# Patient Record
Sex: Female | Born: 1940 | Race: Black or African American | Hispanic: No | Marital: Married | Smoking: Smoker, current status unknown
Health system: Southern US, Community
[De-identification: ages and names within clinical notes are randomized; demographics above are authoritative.]

---

## 2005-01-06 ENCOUNTER — Other Ambulatory Visit: Payer: Self-pay

## 2005-05-13 ENCOUNTER — Other Ambulatory Visit: Payer: Self-pay

## 2007-02-25 ENCOUNTER — Other Ambulatory Visit: Payer: Self-pay

## 2007-03-23 ENCOUNTER — Other Ambulatory Visit: Payer: Self-pay

## 2007-03-24 ENCOUNTER — Other Ambulatory Visit: Payer: Self-pay

## 2011-06-28 ENCOUNTER — Other Ambulatory Visit: Payer: Self-pay

## 2011-07-24 ENCOUNTER — Other Ambulatory Visit: Payer: Self-pay | Admitting: Cardiothoracic Surgery

## 2011-09-03 ENCOUNTER — Other Ambulatory Visit: Payer: Self-pay

## 2011-11-19 ENCOUNTER — Other Ambulatory Visit: Payer: Self-pay | Admitting: Emergency Medicine

## 2013-08-14 IMAGING — CR DG CHEST 1V
1 series · 1 of 1 positions shown · non-contrast
Comparison: none

REASON FOR EXAM: FEVER, COUGH
COMMENTS:   May transport without cardiac monitor

[view not recorded]
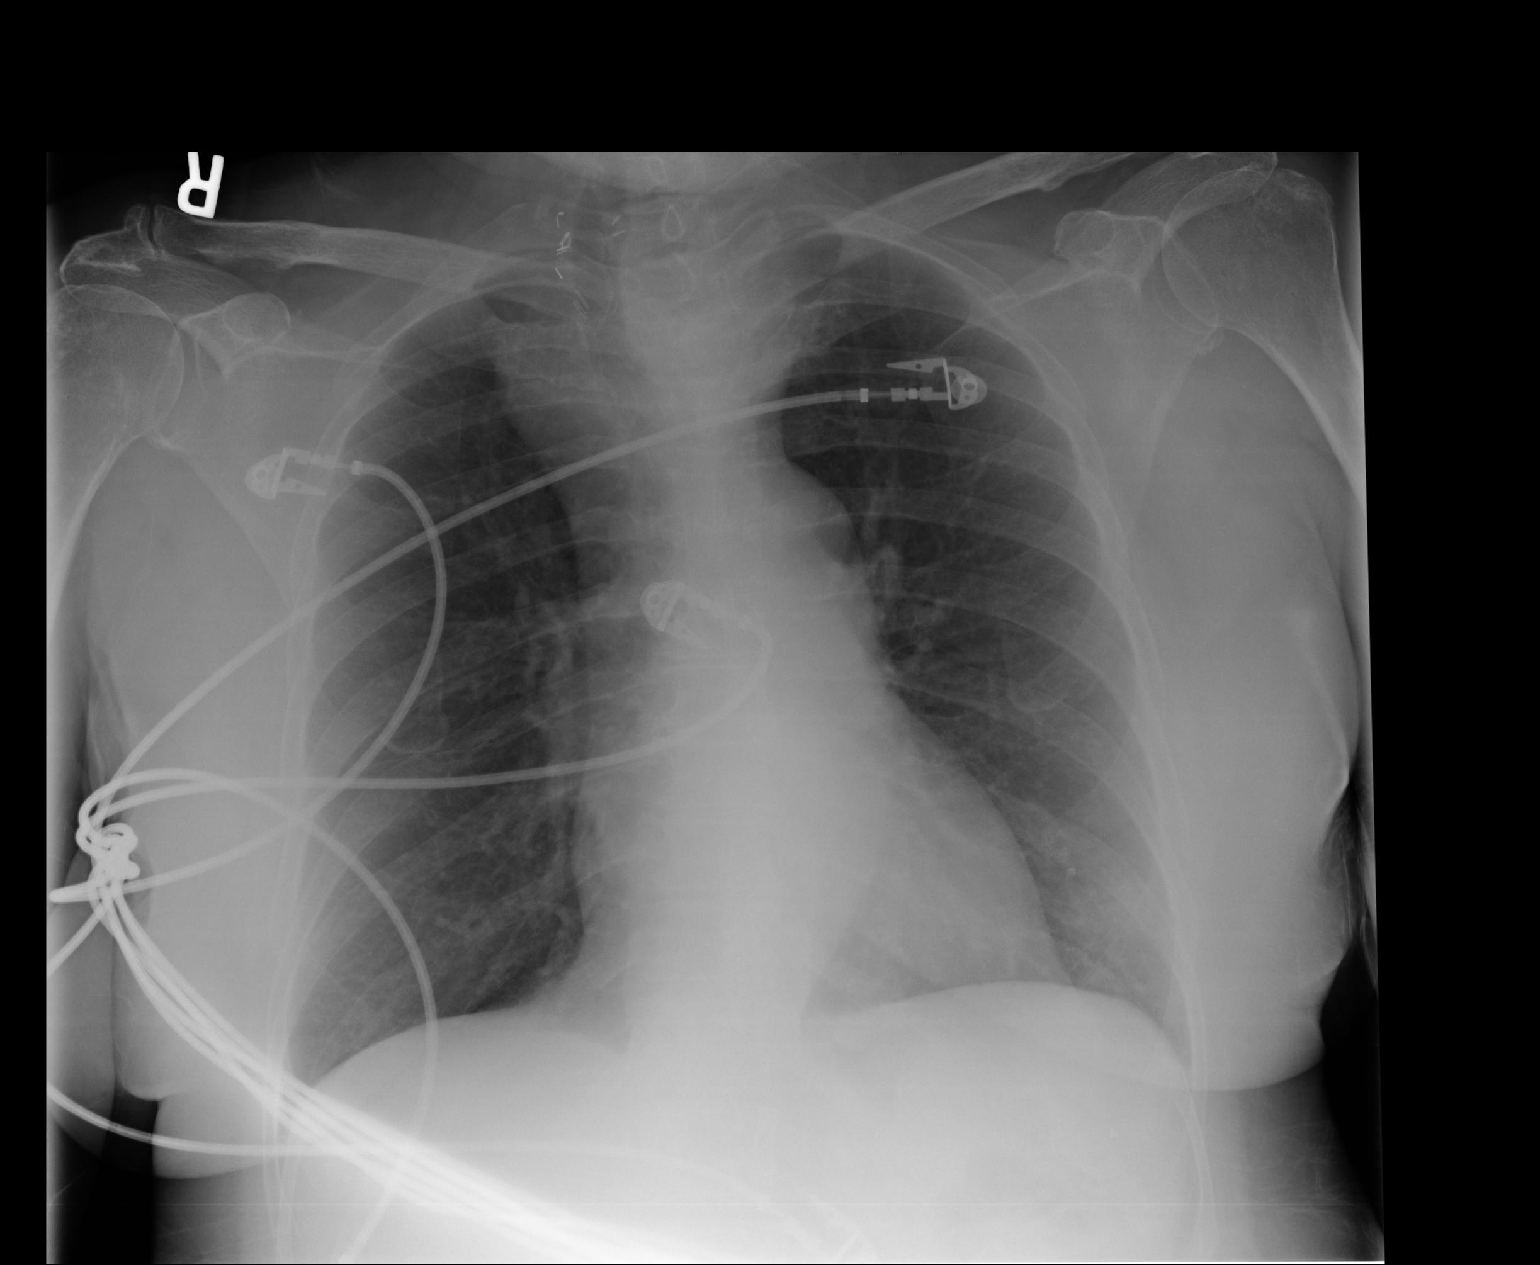

[1 of 1 positions shown; findings below may reference images not displayed]

PROCEDURE:     DXR - DXR CHEST 1 VIEWAP OR PA  - June 28, 2011 [DATE]

RESULT:     Comparison is made to the study 18 February, 2011.

The lungs are well-expanded and clear. The cardiac silhouette is normal in
size. There is tortuosity of the descending thoracic aorta. There is soft
tissue fullness in the superior mediastinum with displacement of the trachea
toward the right. This is not new but is subjectively more prominent today
likely due to the AP technique. I see no pulmonary vascular congestion or
alveolar infiltrate or pleural effusion.
IMPRESSION: I do not see evidence of pneumonia. There is persistent
soft tissue abnormality in the superior mediastinum. Further evaluation of
this region is recommended to exclude underlying vascular masses,
lymphadenopathy, or thyroid pathology.

## 2013-08-14 IMAGING — CT CT HEAD WITHOUT CONTRAST
2 series · 15 of 30 positions shown, 19 images · non-contrast
Comparison: none

REASON FOR EXAM: HTN, HA
COMMENTS:   May transport without cardiac monitor

[Series 2: without · axial · non-contrast · 0.43mm/px · z∈[+288,+408]mm · 13 of 30 slices shown, 17 images]
[im 3/30  brain]
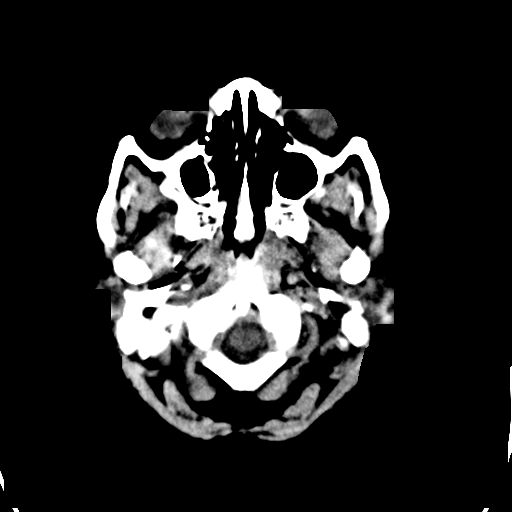
[im 3/30  bone]
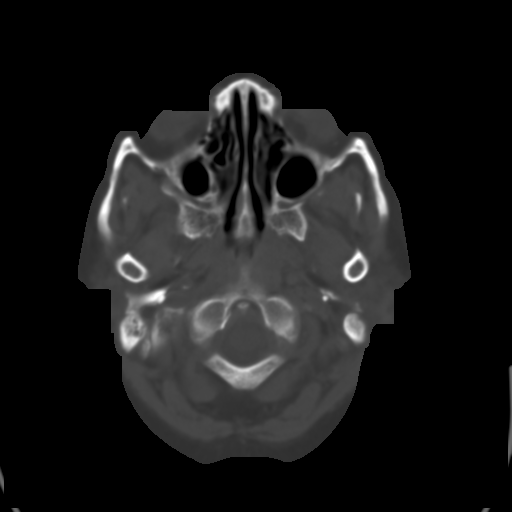
[im 5/30  brain]
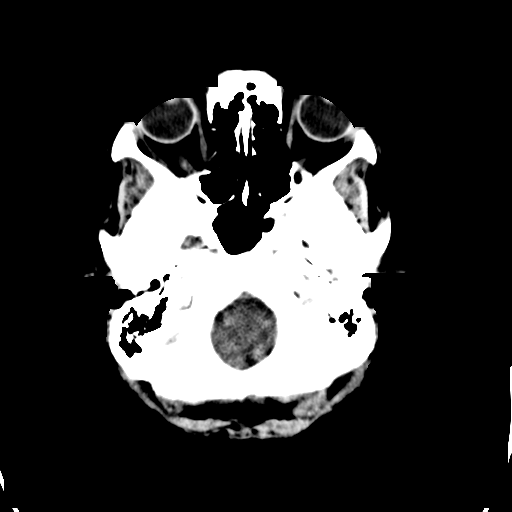
[im 7/30  brain]
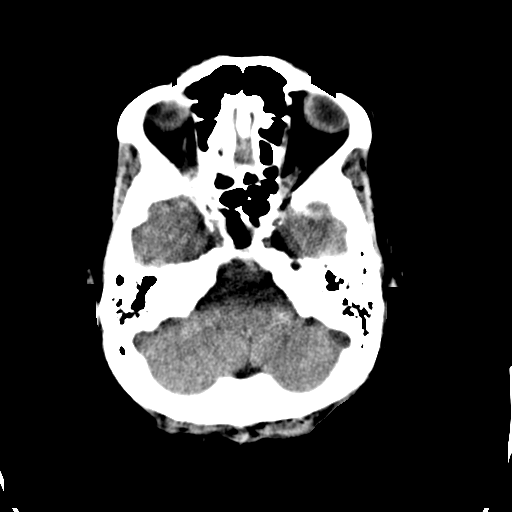
[im 9/30  brain]
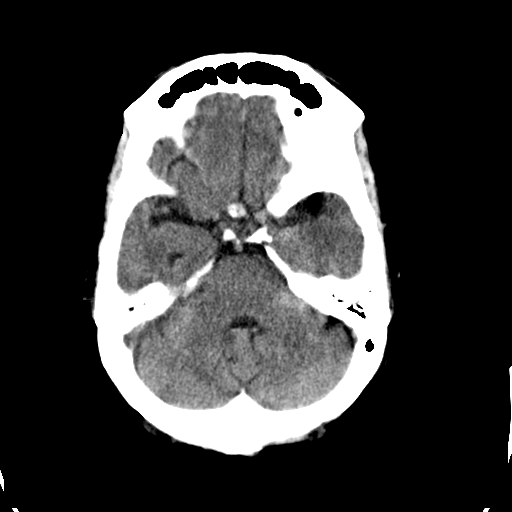
[im 11/30  brain]
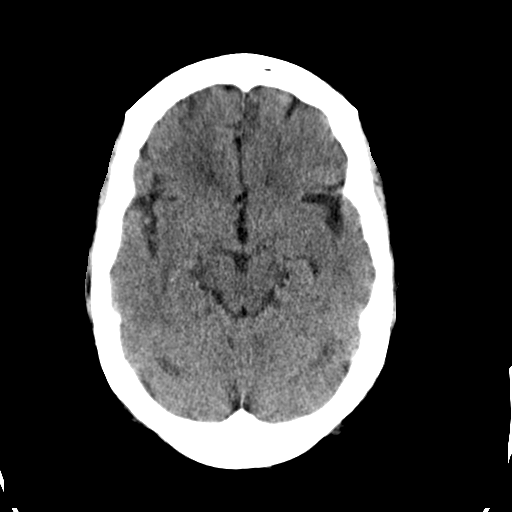
[im 11/30  bone]
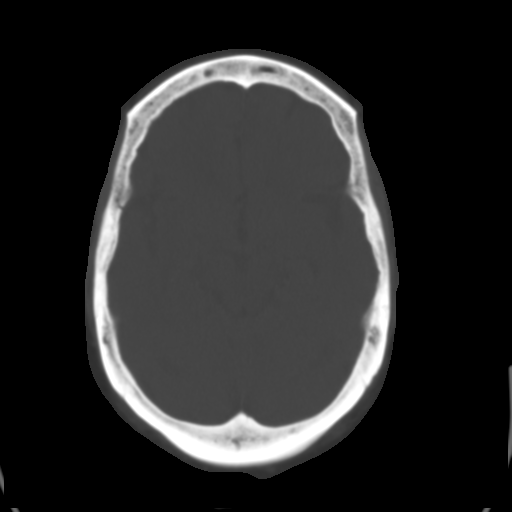
[im 13/30  brain]
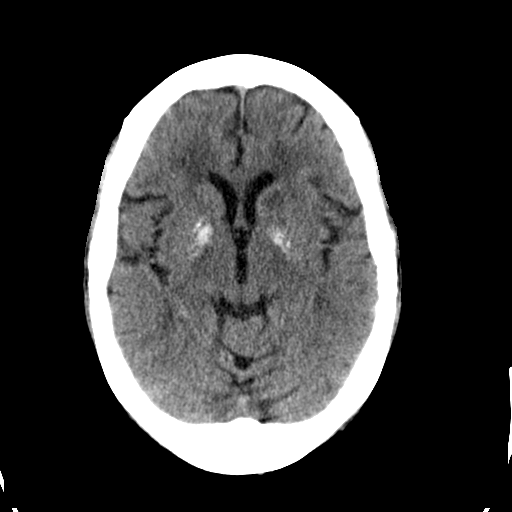
[im 15/30  brain]
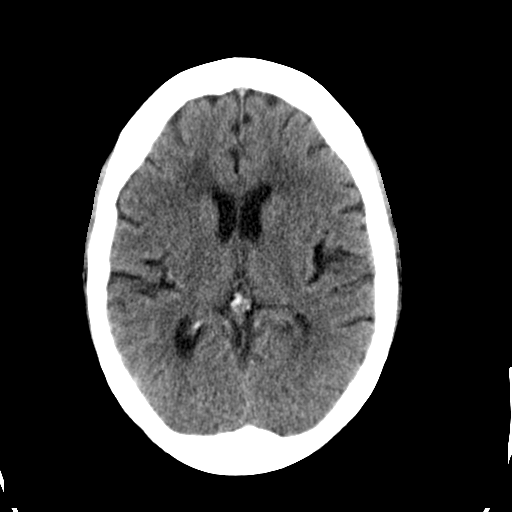
[im 17/30  brain]
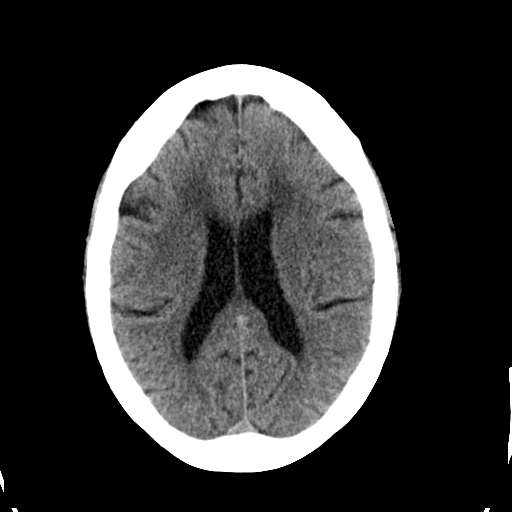
[im 19/30  brain]
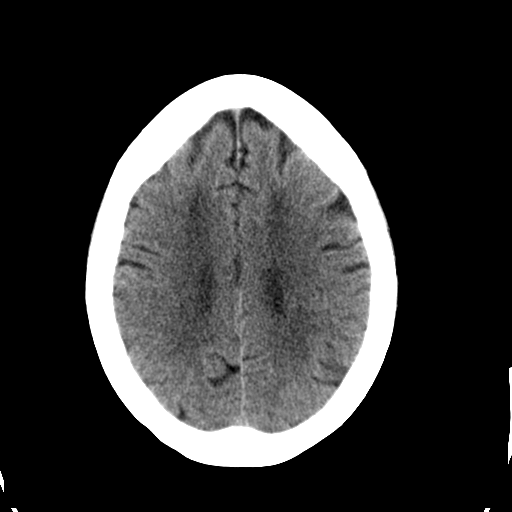
[im 19/30  bone]
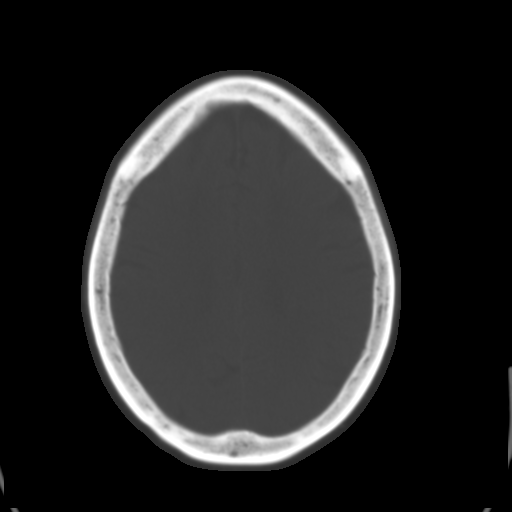
[im 21/30  brain]
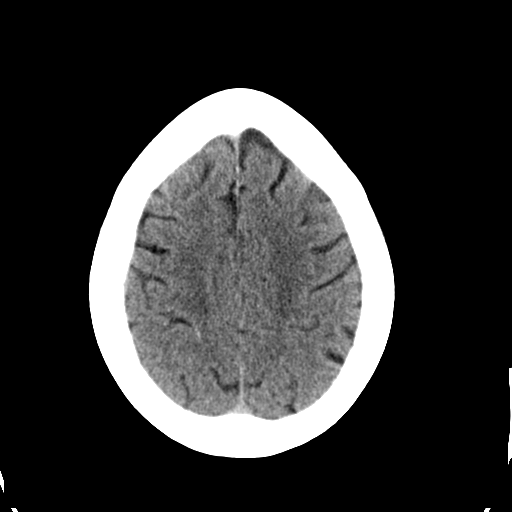
[im 23/30  brain]
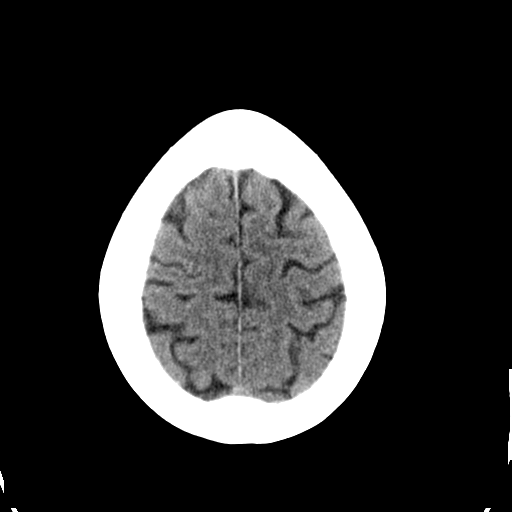
[im 25/30  brain]
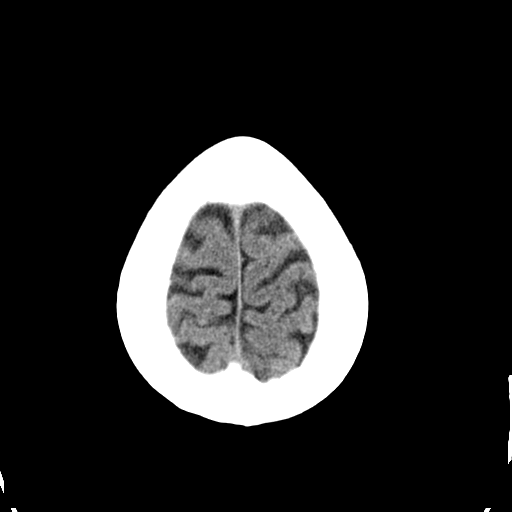
[im 27/30  brain]
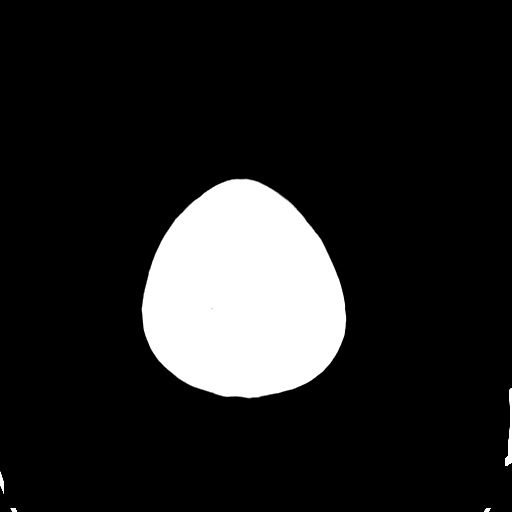
[im 27/30  bone]
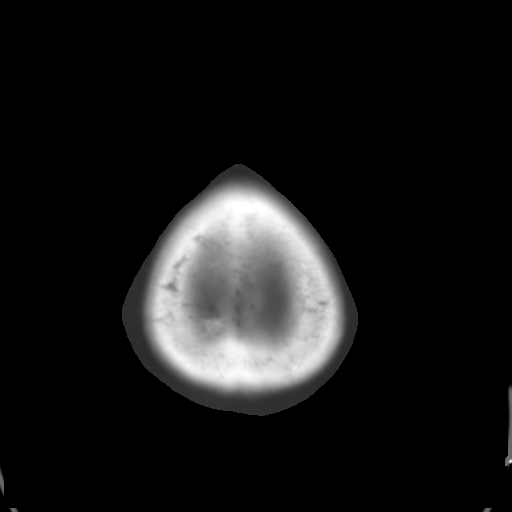

[Series 3: bone · axial · 0.43mm/px · z∈[+288,+308]mm · 2 of 30 slices shown]
[im 3/30  bone]
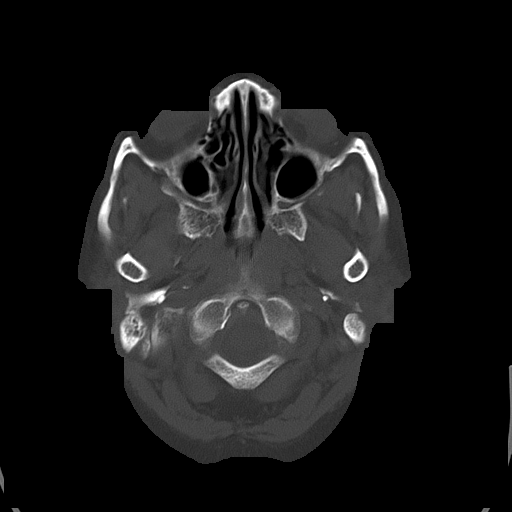
[im 7/30  bone]
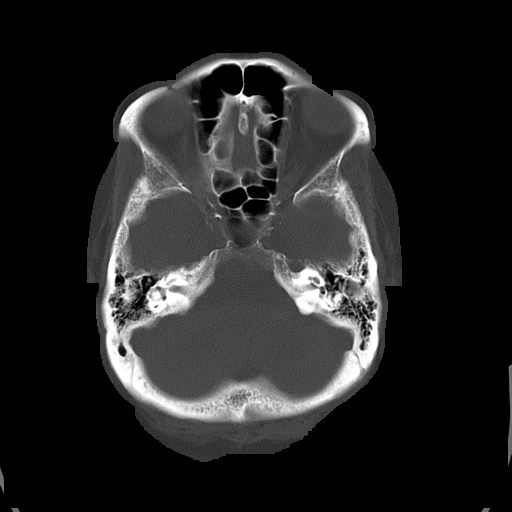

[15 of 30 positions shown; findings below may reference images not displayed]

PROCEDURE:     CT  - CT HEAD WITHOUT CONTRAST  - June 28, 2011 [DATE]

RESULT:     Axial noncontrast CT scanning was performed through the brain at
5 mm intervals and slice thicknesses. There are no previous studies for
comparison.

The ventricles are normal in size and position. There is dense calcification
in the basal ganglia bilaterally. There is decreased density in the left
caudate nucleus laterally consistent with previous ischemic insult. There is
decreased density in the deep white matter of both cerebral hemispheres
consistent with chronic small vessel ischemic type change. I do not see
evidence of an acute intracranial hemorrhage nor acute evolving ischemic
event. The cerebellum and brainstem are normal in density.

At bone window settings the observed portions of the paranasal sinuses and
mastoid air cells are clear. There is no evidence of an acute skull fracture.
IMPRESSION: 1. I see no evidence of an acute ischemic or hemorrhagic event.
2. There are chronic changes consistent with small vessel ischemia. An old
small lacunar infarction in the left caudate nucleus is suspected.
3. There are dense calcifications in the basal ganglia bilaterally.

## 2014-07-11 ENCOUNTER — Other Ambulatory Visit: Payer: Self-pay | Admitting: Internal Medicine

## 2014-07-12 ENCOUNTER — Other Ambulatory Visit: Payer: Self-pay | Admitting: Emergency Medicine

## 2014-07-14 ENCOUNTER — Encounter: Payer: Self-pay | Admitting: *Deleted

## 2014-07-14 ENCOUNTER — Other Ambulatory Visit (HOSPITAL_COMMUNITY): Payer: Self-pay | Admitting: Physician Assistant

## 2014-07-14 ENCOUNTER — Inpatient Hospital Stay (HOSPITAL_COMMUNITY)
Admission: RE | Admit: 2014-07-14 | Discharge: 2014-08-04 | DRG: 057 | Disposition: A | Discharge status: Home Health | Payer: Medicare PPO | Source: Other Acute Inpatient Hospital | Attending: Physical Medicine & Rehabilitation | Admitting: Physical Medicine & Rehabilitation

## 2014-07-14 DIAGNOSIS — I10 Essential (primary) hypertension: Secondary | ICD-10-CM | POA: Diagnosis not present

## 2014-07-14 DIAGNOSIS — Z794 Long term (current) use of insulin: Secondary | ICD-10-CM | POA: Diagnosis not present

## 2014-07-14 DIAGNOSIS — K5900 Constipation, unspecified: Secondary | ICD-10-CM | POA: Diagnosis not present

## 2014-07-14 DIAGNOSIS — Z880 Allergy status to penicillin: Secondary | ICD-10-CM

## 2014-07-14 DIAGNOSIS — I69351 Hemiplegia and hemiparesis following cerebral infarction affecting right dominant side: Secondary | ICD-10-CM | POA: Diagnosis present

## 2014-07-14 DIAGNOSIS — Z8249 Family history of ischemic heart disease and other diseases of the circulatory system: Secondary | ICD-10-CM | POA: Diagnosis not present

## 2014-07-14 DIAGNOSIS — E785 Hyperlipidemia, unspecified: Secondary | ICD-10-CM | POA: Diagnosis present

## 2014-07-14 DIAGNOSIS — Z79899 Other long term (current) drug therapy: Secondary | ICD-10-CM | POA: Diagnosis not present

## 2014-07-14 DIAGNOSIS — E1149 Type 2 diabetes mellitus with other diabetic neurological complication: Secondary | ICD-10-CM | POA: Diagnosis not present

## 2014-07-14 DIAGNOSIS — Z833 Family history of diabetes mellitus: Secondary | ICD-10-CM | POA: Diagnosis not present

## 2014-07-14 DIAGNOSIS — N390 Urinary tract infection, site not specified: Secondary | ICD-10-CM | POA: Diagnosis present

## 2014-07-14 DIAGNOSIS — R29898 Other symptoms and signs involving the musculoskeletal system: Secondary | ICD-10-CM | POA: Diagnosis not present

## 2014-07-14 DIAGNOSIS — I63512 Cerebral infarction due to unspecified occlusion or stenosis of left middle cerebral artery: Secondary | ICD-10-CM

## 2014-07-14 DIAGNOSIS — Z7982 Long term (current) use of aspirin: Secondary | ICD-10-CM

## 2014-07-14 DIAGNOSIS — Z5189 Encounter for other specified aftercare: Secondary | ICD-10-CM

## 2014-07-14 DIAGNOSIS — I6330 Cerebral infarction due to thrombosis of unspecified cerebral artery: Secondary | ICD-10-CM

## 2014-07-14 DIAGNOSIS — E1142 Type 2 diabetes mellitus with diabetic polyneuropathy: Secondary | ICD-10-CM | POA: Diagnosis present

## 2014-07-14 DIAGNOSIS — F17200 Nicotine dependence, unspecified, uncomplicated: Secondary | ICD-10-CM | POA: Diagnosis not present

## 2014-07-14 DIAGNOSIS — I6350 Cerebral infarction due to unspecified occlusion or stenosis of unspecified cerebral artery: Secondary | ICD-10-CM | POA: Diagnosis not present

## 2014-07-14 DIAGNOSIS — R32 Unspecified urinary incontinence: Secondary | ICD-10-CM | POA: Diagnosis not present

## 2014-07-14 DIAGNOSIS — G8110 Spastic hemiplegia affecting unspecified side: Secondary | ICD-10-CM | POA: Diagnosis not present

## 2014-07-14 DIAGNOSIS — I639 Cerebral infarction, unspecified: Secondary | ICD-10-CM | POA: Diagnosis present

## 2014-07-14 DIAGNOSIS — R471 Dysarthria and anarthria: Secondary | ICD-10-CM | POA: Diagnosis not present

## 2014-07-14 LAB — GLUCOSE, CAPILLARY
Glucose-Capillary: 145 mg/dL — ABNORMAL HIGH (ref 70–99)
Glucose-Capillary: 153 mg/dL — ABNORMAL HIGH (ref 70–99)

## 2014-07-14 MED ORDER — METOPROLOL TARTRATE 50 MG PO TABS
50.0000 mg | ORAL_TABLET | Freq: Every day | ORAL | Status: DC
  Administered 2014-07-15 – 2014-08-04 (×21): 50 mg via ORAL
  Filled 2014-07-14 (×22): qty 1

## 2014-07-14 MED ORDER — ASPIRIN 81 MG PO CHEW
81.0000 mg | CHEWABLE_TABLET | Freq: Every day | ORAL | Status: DC
  Administered 2014-07-15 – 2014-08-04 (×21): 81 mg via ORAL
  Filled 2014-07-14 (×23): qty 1

## 2014-07-14 MED ORDER — INSULIN GLARGINE 100 UNIT/ML ~~LOC~~ SOLN
25.0000 [IU] | Freq: Every day | SUBCUTANEOUS | Status: DC
  Administered 2014-07-15 – 2014-07-24 (×10): 25 [IU] via SUBCUTANEOUS
  Filled 2014-07-14 (×11): qty 0.25

## 2014-07-14 MED ORDER — ACETAMINOPHEN 325 MG PO TABS
325.0000 mg | ORAL_TABLET | ORAL | Status: DC | PRN
  Administered 2014-07-14 – 2014-08-02 (×17): 650 mg via ORAL
  Filled 2014-07-14 (×19): qty 2

## 2014-07-14 MED ORDER — SORBITOL 70 % SOLN
30.0000 mL | Freq: Every day | Status: DC | PRN
  Administered 2014-07-15 – 2014-08-03 (×2): 30 mL via ORAL
  Filled 2014-07-14 (×2): qty 30

## 2014-07-14 MED ORDER — INSULIN ASPART 100 UNIT/ML ~~LOC~~ SOLN
0.0000 [IU] | Freq: Every day | SUBCUTANEOUS | Status: DC
  Administered 2014-07-28: 3 [IU] via SUBCUTANEOUS

## 2014-07-14 MED ORDER — ONDANSETRON HCL 4 MG PO TABS: 4.0000 mg | ORAL_TABLET | Freq: Four times a day (QID) | ORAL | Status: DC | PRN

## 2014-07-14 MED ORDER — HYDROCHLOROTHIAZIDE 12.5 MG PO CAPS
12.5000 mg | ORAL_CAPSULE | Freq: Every day | ORAL | Status: DC
  Administered 2014-07-15 – 2014-07-17 (×3): 12.5 mg via ORAL
  Filled 2014-07-14 (×5): qty 1

## 2014-07-14 MED ORDER — OXYBUTYNIN CHLORIDE 5 MG PO TABS
2.5000 mg | ORAL_TABLET | Freq: Two times a day (BID) | ORAL | Status: DC
  Administered 2014-07-14 – 2014-07-26 (×24): 2.5 mg via ORAL
  Filled 2014-07-14 (×29): qty 0.5

## 2014-07-14 MED ORDER — AMLODIPINE BESYLATE 10 MG PO TABS
10.0000 mg | ORAL_TABLET | Freq: Every day | ORAL | Status: DC
  Administered 2014-07-15 – 2014-08-04 (×21): 10 mg via ORAL
  Filled 2014-07-14 (×22): qty 1

## 2014-07-14 MED ORDER — ONDANSETRON HCL 4 MG/2ML IJ SOLN: 4.0000 mg | Freq: Four times a day (QID) | INTRAMUSCULAR | Status: DC | PRN

## 2014-07-14 MED ORDER — INSULIN ASPART 100 UNIT/ML ~~LOC~~ SOLN
0.0000 [IU] | Freq: Three times a day (TID) | SUBCUTANEOUS | Status: DC
Start: 2014-07-15 — End: 2014-08-04
  Administered 2014-07-15: 1 [IU] via SUBCUTANEOUS
  Administered 2014-07-15: 2 [IU] via SUBCUTANEOUS
  Administered 2014-07-15: 1 [IU] via SUBCUTANEOUS
  Administered 2014-07-16: 3 [IU] via SUBCUTANEOUS
  Administered 2014-07-17 – 2014-07-18 (×3): 1 [IU] via SUBCUTANEOUS
  Administered 2014-07-18: 2 [IU] via SUBCUTANEOUS
  Administered 2014-07-18 – 2014-07-19 (×2): 1 [IU] via SUBCUTANEOUS
  Administered 2014-07-19 – 2014-07-20 (×2): 2 [IU] via SUBCUTANEOUS
  Administered 2014-07-20: 1 [IU] via SUBCUTANEOUS
  Administered 2014-07-21 – 2014-07-23 (×3): 3 [IU] via SUBCUTANEOUS
  Administered 2014-07-24 (×2): 1 [IU] via SUBCUTANEOUS
  Administered 2014-07-25 (×2): 2 [IU] via SUBCUTANEOUS
  Administered 2014-07-26: 3 [IU] via SUBCUTANEOUS
  Administered 2014-07-28: 5 [IU] via SUBCUTANEOUS
  Administered 2014-07-29 (×2): 2 [IU] via SUBCUTANEOUS
  Administered 2014-07-30: 3 [IU] via SUBCUTANEOUS
  Administered 2014-08-01 – 2014-08-03 (×4): 1 [IU] via SUBCUTANEOUS

## 2014-07-14 MED ORDER — ATORVASTATIN CALCIUM 80 MG PO TABS
80.0000 mg | ORAL_TABLET | Freq: Every day | ORAL | Status: DC
  Administered 2014-07-14 – 2014-08-03 (×21): 80 mg via ORAL
  Filled 2014-07-14 (×22): qty 1

## 2014-07-14 MED ORDER — CIPROFLOXACIN HCL 500 MG PO TABS
500.0000 mg | ORAL_TABLET | Freq: Two times a day (BID) | ORAL | Status: AC
  Administered 2014-07-14 – 2014-07-19 (×10): 500 mg via ORAL
  Filled 2014-07-14 (×10): qty 1

## 2014-07-14 MED ORDER — LISINOPRIL 40 MG PO TABS
40.0000 mg | ORAL_TABLET | Freq: Every day | ORAL | Status: DC
  Administered 2014-07-15 – 2014-08-04 (×21): 40 mg via ORAL
  Filled 2014-07-14 (×22): qty 1

## 2014-07-14 NOTE — Progress Notes (Signed)
[facility] Admission Coordinator Signed Physical Medicine and Rehabilitation PMR Pre-admission Service date: 07/14/2014 9:42 AM  Related encounter: Documentation from 07/14/2014 in [facility] Admission Coordinator Pre-Admission Assessment   Patient: Megan Moore is an 73 y.o., female MRN: [id number] DOB: 05-26-1941 Height: _0  (160 cm) Weight: 63.957 kg (141 lb)   Insurance Information HMO: yes    PPO:      PCP:      IPA:      80/20:      OTHER:   [facility]         Policy#: [id number]      Subscriber: self CM Name: Clayborne Artist, RN      Phone#: [phone]     Fax#: [phone] Follow up will be with onsite reviewer Shae. Pre-Cert#:                                Employer: retired Benefits:  Phone #: [phone]        Name: Ginnie Smart. Date: 11-03-13     Deduct: none      Out of Pocket Max: $4900 (met $183.28)      Life Max: unlimited CIR: $245/day for days 1-7; pre-auth needed      SNF: $0/day for days 1-20; $156/day for days 21-100 (100 day visit max, pre-auth needed) Outpatient: 100%     Co-Pay: $40 copay, based on med necessity Home Health: 100%      Co-Pay: no copay, based on med necessity DME: 80%     Co-Pay: 20% Providers: in network  Emergency Contact Information Contact Information     Name  Delia L    [phone]    [phone]     Oswaldo Milian  Daughter      [phone]     Arvid Right      [phone]     Kae Heller      [phone]         Current Medical History  Patient Admitting Diagnosis: Left internal capsule CVA   History of Present Illness: This 73 year old feamile with history of diabetes, hypertension, hyperlipidemia presented to [facility]) on 07-12-14 with worsening right sided weakness that started 2-3 days prior. The patient says that when she tried to get up and mover her  right side, she felt more and more weak. Pt came to ER on 9-8 and had head CT which was negative. She was noted to have a urinary tract infection, started on oral ciprofloxacin and discharged home. She took a couple doses of the Cipro but her weakness on the right side has progressively gotten worse. She then came to ER at [facility] for further evaluation. MRI testing revealed an acute stroke of the left side of the internal capsule. PT/OT have recommended inpatient rehab for further rehab needs and family is very supportive.    Patient's medical record from [facility] has been reviewed by the rehabilitation admission coordinator and physician.   Past Medical History  Diabetes, hypertension and hyperlipidemia. 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 These were not documented formally in [facility] records.   Family History  The patient's mother and father are both deceased. Mother died from  complications of renal failure (she was on dialysis) and she had a stroke. Father died from a stroke.   Prior Rehab/Hospitalizations: Dtr reports that pt had a previous hospitalization approx 3 years ago when part of her lung was removed and had an MI. No follow rehab after these events.     Current Medications See MAR   Patients Current Diet:  Low sodium diet   Precautions / Restrictions Precautions Precautions: Fall    Prior Activity Level Limited Community (1-2x/wk): Pt got out on limited errands and to church each week. [name] husband/family did the driving. Pt was independent in all ADL's.   Home Assistive Devices / Equipment Home Assistive Devices/Equipment: None      Prior Functional Level  Current Functional Level   Bed Mobility   Independent   Max assist (mod to max assist w/ R lat/posterior lean)    Transfers   Independent   Max assist (max assist x 2 stand pivot, altered midline)    Mobility - Walk/Wheelchair    Independent   Other (not assessed at this time)    Upper Body Dressing   Independent   Other (not assessed at this time, anticipate needs)    Lower Body Dressing   Independent   Max assist (max assist x 2 to don pants, unable to don sock)    Grooming   Independent   Other (unable to use dominant R hand, not assessed with L hand)    Eating/Drinking   Independent   Mod assist (unable to eat with R hand, needs food cut and containers opened)    Toilet Transfer   Independent   Other (using bed pan currently, anticipate max assist x 2 to [facility])    Bladder Continence     WFL   using Bedpan, some incontinence documented    Bowel Management   WFL   no BM documented since admit at [place]   Other (not assessed at this time.)    Communication   [facility]   [facility]    Memory   WFL   appears WFL    Cooking/Meal Prep   Independent        Housework   Independent      Money Management   Independent      Driving   Pt did not drive. Her husband/family did the driving.        Special needs/care consideration BiPAP/CPAP no   CPM no   Continuous Drip IV no   Dialysis no          Life Vest no   Oxygen no   Special Bed no   Trach Size no   Wound Vac (area) no        Skin no issues                                Bowel mgmt: pt has not had a BM since admit to [facility] on 9-9 Bladder mgmt:some urinary incontinence documented, using bed pan Diabetic mgmt - yes, managed with insulin at home   Previous Home Environment Living Arrangements: Spouse/significant other  Lives With: Spouse Available Help at Discharge: Family Type of Home: House Home Layout: One level Home Access: Stairs to enter Entrance Stairs-Rails: Psychiatric nurse of Steps: 6   Discharge Living Setting Plans for Discharge Living Setting: Patient's home Type of Home at Discharge: [facility]  Discharge Home Layout: One level Discharge Home Access: Stairs to enter Entrance  Stairs-Rails: Right;Left Entrance Stairs-Number of Steps: 6 Does the patient have any problems obtaining your medications?: No   Social/Family/Support Systems Patient Roles: Spouse (involved in her church) Sport and exercise psychologist Information: husband Jenny Reichmann and dtr Seth Bake are primary contacts Anticipated Caregiver: husband and children Anticipated Caregiver's Contact Information: see above Ability/Limitations of Caregiver: none, husband and children are very supportive Caregiver Availability: 24/7 Discharge Plan Discussed with Primary Caregiver: Yes (discussed with husband, dtr and 2 other family members) Is Caregiver In Agreement with Plan?: Yes Does Caregiver/Family have Issues with Lodging/Transportation while Pt is in Rehab?: No   Note: [name] son Karma Greaser is rehab tech at [facility] and very supportive of inpatient rehab.   Goals/Additional Needs Patient/Family Goal for Rehab: Minimal assistance to Supervision with PT/OT, Ind with SLP Expected length of stay: 11-13 days Cultural Considerations: none. Pt is involved in her church. Dietary Needs: regular low sodium diet Equipment Needs: to be determined Pt/Family Agrees to Admission and willing to participate: Yes Program Orientation Provided & Reviewed with Pt/Caregiver Including Roles  & Responsibilities: Yes   Patient Condition: I met with pt and her family at [facility]) on 07-13-14 to discuss the possibility of inpatient rehab. This 73 year old patient was previously independent and enjoyed her church prior to this new left internal capsule stroke. Pt is currently requiring maximal assistance with bed mobility and maximal assistance of 2 for stand pivot transfers. She is needed maximal help with most aspects of her activities of daily living and her dominant right hand has been affected by the stroke as well. Pt will benefit from the multi-disciplinary team of skilled PT, OT, SLP and rehab nursing to maximize her functional  return following this CVA. PT, OT and rehab nursing will focus on increasing strength for greater independence in bed mobility, transfers, gait and self care skills. In addition, pt will benefit from SLP intervention to determine any higher level cognitive needs as a result of the CVA. In addition, pt will benefit from rehab physician intervention to monitor her diabetes, HTN and HLP in addition to monitoring her stroke recovery. Discussed case with Dr. Naaman Plummer who stated that pt is a good inpatient rehab candidate. We received medical clearance from Dr. Netty Starring at [facility]. Pt and her family are motivated to come to inpatient rehab and will benefit from the intensive services of skilled therapy under rehab physician guidance. Pt will be admitted today on 07-14-14.   Preadmission Screen Completed By:  Nanetta Batty, PT, 07/14/2014 9:42 AM ______________________________________________________________________    Discussed status with Dr. Naaman Plummer on 07-14-14 at [phone] and received telephone approval for admission today.   Admission Coordinator:  Nanetta Batty, PT, time 0942/Date 07-14-14    Assessment/Plan: Diagnosis: left internal capsule infarct Does the need for close, 24 hr/day  Medical supervision in concert with the patient's rehab needs make it unreasonable for this patient to be served in a less intensive setting? Yes Co-Morbidities requiring supervision/potential complications: dm, htn, increased lipids Due to bladder management, bowel management, safety, skin/wound care, disease management, medication administration, pain management and patient education, does the patient require 24 hr/day rehab nursing? Yes Does the patient require coordinated care of a physician, rehab nurse, PT (1-2 hrs/day, 5 days/week), OT (1-2 hrs/day, 5 days/week) and SLP (1-2 hrs/day, 5 days/week) to address physical and functional deficits in the context of the above medical diagnosis(es)? Yes Addressing deficits in  the following areas: balance, endurance,  locomotion, strength, transferring, bowel/bladder control, bathing, dressing, feeding, grooming, toileting, speech,   and psychosocial support Can the patient actively participate in an intensive therapy program of at least 3 hrs of therapy 5 days a week? Yes The potential for patient to make measurable gains while on inpatient rehab is excellent Anticipated functional outcomes upon discharge from inpatients are: supervision and min assist PT, supervision and min assist OT, independent SLP Estimated rehab length of stay to reach the above functional goals is: 11-13 days Does the patient have adequate social supports to accommodate these discharge functional goals? Yes Anticipated D/C setting: Home Anticipated post D/C treatments: HH therapy and Outpatient therapy Overall Rehab/Functional Prognosis: excellent       RECOMMENDATIONS: This patient's condition is appropriate for continued rehabilitative care in the following setting: CIR Patient has agreed to participate in recommended program. Yes Note that insurance prior authorization may be required for reimbursement for recommended care.   Comment: Admit today.    Meredith Staggers, MD, [facility] 07/14/2014     Nanetta Batty, PT 07/14/2014  Revision History...     Date/Time User Action   07/14/2014 1:14 PM Meredith Staggers, MD Sign   07/14/2014 12:09 PM [facility] Details Report

## 2014-07-14 NOTE — Progress Notes (Signed)
Patient ID: RHYANN BERTON, female   DOB: Apr 06, 1941, 73 y.o.   MRN: [id number] Patient admitted to [phone] via stretcher, escorted by EMS staff.  Patient verbalized understanding of rehab process, no questions asked.  Patient was unable to sign fall prevention plan due to mobility deficits but assured staff she would not try to get up unassisted.  Call bell within reach.  Will continue to monitor.  Dani Gobble, RN

## 2014-07-14 NOTE — PMR Pre-admission (Signed)
[place] PMR Admission Coordinator Pre-Admission Assessment  Patient: Megan Moore is an 73 y.o., female MRN: [id number] DOB: 1941/10/13 Height: 5\' 3"  (160 cm) Weight: 63.957 kg (141 lb)  Insurance Information HMO: yes    PPO:      PCP:      IPA:      80/20:      [facility]:  [facility]       Policy#: [id number]      Subscriber: self CM Name: Megan Artist, RN      Phone#: [phone]     Fax#: [phone] Follow up will be with onsite reviewer Megan Moore. Pre-Cert#:                                Employer: retired Benefits:  Phone #: [phone]      Name: Megan Moore. Date: 11-03-13     Deduct: none      Out of Pocket Max: $4900 (met $183.28)      Life Max: unlimited CIR: $245/day for days 1-7; pre-auth needed      SNF: $0/day for days 1-20; $156/day for days 21-100 (100 day visit max, pre-auth needed) Outpatient: 100%     Co-Pay: $40 copay, based on med necessity Home Health: 100%      Co-Pay: no copay, based on med necessity DME: 80%     Co-Pay: 20% Providers: in network  Emergency Contact Information Contact Information   Name Megan Moore  [phone]  [phone]   Megan Moore Daughter   [phone]   Megan Moore   [phone]   Megan Moore   [phone]      Current Medical History  Patient Admitting Diagnosis: Left internal capsule CVA  History of Present Illness: This 73 year old feamile with history of diabetes, hypertension, hyperlipidemia presented to [facility]) on 07-12-14 with worsening Moore sided weakness that started 2-3 days prior. The patient says that when she tried to get up and mover her Moore side, she felt more and more weak. Pt came to ER on 9-8 and had head CT which was negative. She was noted to have a urinary tract infection, started on oral ciprofloxacin and discharged home. She took a couple doses of the Cipro but her weakness on the Moore side has progressively  gotten worse. She then came to ER at [facility] for further evaluation. MRI testing revealed an acute stroke of the left side of the internal capsule. PT/OT have recommended inpatient rehab for further rehab needs and family is very supportive.   Patient's medical record from [facility] has been reviewed by the rehabilitation admission coordinator and physician.  Past Medical History  Diabetes, hypertension and hyperlipidemia. 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 These were not documented formally in [facility] records.  Family History  The patient's mother and father are both deceased. Mother died from complications of renal failure (she was on dialysis) and she had a stroke. Father died from a stroke.  Prior Rehab/Hospitalizations: Dtr reports that pt had a previous hospitalization approx 3 years ago when part of her lung was removed and had an MI. No follow rehab after these events.   Current Medications See MAR  Patients Current Diet:  Low sodium diet  Precautions / Restrictions Precautions Precautions: Fall   Prior Activity Level Limited  Community (1-2x/wk): Pt got out on limited errands and to church each week. [name] husband/family did the driving. Pt was independent in all ADL's.  Home Assistive Devices / Equipment Home Assistive Devices/Equipment: None   Prior Functional Level Current Functional Level  Bed Mobility  Independent  Max assist (mod to max assist w/ R lat/posterior lean)   Transfers  Independent  Max assist (max assist x 2 stand pivot, altered midline)   Mobility - Walk/Wheelchair  Independent  Other (not assessed at this time)   Upper Body Dressing  Independent  Other (not assessed at this time, anticipate needs)   Lower Body Dressing  Independent  Max assist (max assist x 2 to don pants, unable to don sock)   Grooming  Independent  Other (unable to use dominant R hand, not assessed  with Moore hand)   Eating/Drinking  Independent  Mod assist (unable to eat with R hand, needs food cut and containers opened)   Toilet Transfer  Independent  Other (using bed pan currently, anticipate max assist x 2 to [facility])   Bladder Continence   WFL  using Bedpan, some incontinence documented   Bowel Management  WFL  no BM documented since admit at [place]  Other (not assessed at this time.)   Communication  [facility]  [facility]   Memory  WFL  appears WFL   Cooking/Meal Prep  Independent      Housework  Independent    Money Management  Independent    Driving    Pt did not drive. Her husband/family did the driving.    Special needs/care consideration BiPAP/CPAP no  CPM no  Continuous Drip IV no  Dialysis no         Life Vest no  Oxygen no  Special Bed no  Trach Size no  Wound Vac (area) no       Skin no issues                               Bowel mgmt: pt has not had a BM since [facility] on [facility]9 Bladder mgmt:some urinary incontinence documented, using bed pan Diabetic mgmt - yes, managed with insulin at home  Previous Home Environment Living Arrangements: Spouse/significant other  Lives With: Spouse Available Help at Discharge: Family Type of Home: House Home Layout: One level Home Access: Stairs to enter Entrance Stairs-Rails: Psychiatric nurse of Steps: 6  Discharge Living Setting Plans for Discharge Living Setting: Patient's home Type of Home at Discharge: House Discharge Home Layout: One level Discharge Home Access: Stairs to enter Entrance Stairs-Rails: Psychiatric nurse of Steps: 6 Does the patient have any problems obtaining your medications?: No  Social/Family/Support Systems Patient Roles: Spouse (involved in her church) Sport and exercise psychologist Information: husband Megan Moore and dtr Megan Moore are primary contacts Anticipated Caregiver: husband and children Anticipated Caregiver's Contact Information: see  above Ability/Limitations of Caregiver: none, husband and children are very supportive Caregiver Availability: 24/7 Discharge Plan Discussed with Primary Caregiver: Yes (discussed with husband, dtr and 2 other family members) Is Caregiver In Agreement with Plan?: Yes Does Caregiver/Family have Issues with Lodging/Transportation while Pt is in Rehab?: No  Note: [name] son Karma Greaser is rehab tech at [employer] and very supportive of inpatient rehab.  Goals/Additional Needs Patient/Family Goal for Rehab: Minimal assistance to Supervision with PT/OT, Ind with SLP Expected length of stay: 11-13 days Cultural Considerations: none. Pt is  involved in her church. Dietary Needs: regular low sodium diet Equipment Needs: to be determined Pt/Family Agrees to Admission and willing to participate: Yes Program Orientation Provided & Reviewed with Pt/Caregiver Including Roles  & Responsibilities: Yes  Patient Condition: I met with pt and her family at [facility] on 07-13-14 to discuss the possibility of inpatient rehab. This 73 year old patient was previously independent and enjoyed her church prior to this new left internal capsule stroke. Pt is currently requiring maximal assistance with bed mobility and maximal assistance of 2 for stand pivot transfers. She is needed maximal help with most aspects of her activities of daily living and her dominant Moore hand has been affected by the stroke as well. Pt will benefit from the multi-disciplinary team of skilled PT, OT, SLP and rehab nursing to maximize her functional return following this CVA. PT, OT and rehab nursing will focus on increasing strength for greater independence in bed mobility, transfers, gait and self care skills. In addition, pt will benefit from SLP intervention to determine any higher level cognitive needs as a result of the CVA. In addition, pt will benefit from rehab physician intervention to monitor her diabetes, HTN and HLP in  addition to monitoring her stroke recovery. Discussed case with Dr. Riley Kill who stated that pt is a good inpatient rehab candidate. We received medical clearance from Dr. Burnadette Pop at [facility]. Pt and her family are motivated to come to inpatient rehab and will benefit from the intensive services of skilled therapy under rehab physician guidance. Pt will be admitted today on 07-14-14.  Preadmission Screen Completed By:  Juliann Mule, PT, 07/14/2014 9:42 AM ______________________________________________________________________   Discussed status with Dr. Riley Kill on 07-14-14 at [phone] and received telephone approval for admission today.  Admission Coordinator:  Juliann Mule, PT, time 0942/Date 07-14-14   Assessment/Plan: Diagnosis: left internal capsule infarct 1. Does the need for close, 24 hr/day  Medical supervision in concert with the patient's rehab needs make it unreasonable for this patient to be served in a less intensive setting? Yes 2. Co-Morbidities requiring supervision/potential complications: dm, htn, increased lipids 3. Due to bladder management, bowel management, safety, skin/wound care, disease management, medication administration, pain management and patient education, does the patient require 24 hr/day rehab nursing? Yes 4. Does the patient require coordinated care of a physician, rehab nurse, PT (1-2 hrs/day, 5 days/week), OT (1-2 hrs/day, 5 days/week) and SLP (1-2 hrs/day, 5 days/week) to address physical and functional deficits in the context of the above medical diagnosis(es)? Yes Addressing deficits in the following areas: balance, endurance, locomotion, strength, transferring, bowel/bladder control, bathing, dressing, feeding, grooming, toileting, speech,   and psychosocial support 5. Can the patient actively participate in an intensive therapy program of at least 3 hrs of therapy 5 days a week? Yes 6. The potential for patient to make measurable gains while on inpatient rehab  is excellent 7. Anticipated functional outcomes upon discharge from inpatients are: supervision and min assist PT, supervision and min assist OT, independent SLP 8. Estimated rehab length of stay to reach the above functional goals is: 11-13 days 9. Does the patient have adequate social supports to accommodate these discharge functional goals? Yes 10. Anticipated D/C setting: Home 11. Anticipated post D/C treatments: HH therapy and Outpatient therapy 12. Overall Rehab/Functional Prognosis: excellent    RECOMMENDATIONS: This patient's condition is appropriate for continued rehabilitative care in the following setting: CIR Patient has agreed to participate in recommended program. Yes Note that insurance prior  authorization may be required for reimbursement for recommended care.  Comment: Admit today.   Meredith Staggers, MD, [place] [facility] 07/14/2014   Nanetta Batty, PT 07/14/2014

## 2014-07-14 NOTE — H&P (Cosign Needed)
Physical Medicine and Rehabilitation Admission H&P  No chief complaint on file.  :  HPI: 73 year old right-handed female with history of hypertension as well as diabetes mellitus with peripheral neuropathy. Presented to [facility] 07/12/2014 with acute onset of right-sided weakness. Cranial CT scan negative. MRI of the brain showed acute infarct left side of the internal capsule as well as remote lacunar infarct of the thalami bilaterally, the left caudate head and posterior right pons. Patient did not receive TPA. Echocardiogram unremarkable. Carotid Dopplers with no ICA stenosis. Neurology services consulted placed on aspirin for CVA prophylaxis. Hospital course urinary tract infection with Cipro initiated 07/12/2014 with end date 07/19/2014. She is tolerating a regular consistency diet. Physical occupational therapy ongoing patient mod max assist for mobility. Patient was admitted for comprehensive rehabilitation program  Review of Systems  Gastrointestinal: Positive for constipation.  Genitourinary: Positive for frequency.  Occasional incontinence  Musculoskeletal: Positive for myalgias.  All other systems reviewed and are negative.   Past medical history; hypertension, diabetes mellitus, hyperlipidemia.  Patient is a remote smoker denies alcohol  Family history: Hypertension, diabetes mellitus  Social History:  Allergies: Penicillin which causes hives  Medications prior to admission. Amlodipine 10 mg daily, Lipitor 80 mg daily, hydrochlorothiazide 12.5 mg daily, Lantus insulin 26 units every morning, lisinopril 40 mg daily, metoprolol 50 mg daily  Home:  patient is married. One level home 6 steps to entry son as a respiratory therapist at [employer].  Functional History:  independent prior to admission  Functional Status:  Mobility:  mod max assist due to right-sided weakness     ADL:  Max assist activities daily living  Cognition:     Physical Exam:128/70 P80 RR18 T 98.6  There were no vitals taken for this visit.  Physical Exam  Vitals reviewed.  Constitutional: She is oriented to person, place, and time. She appears well-developed.  HENT:  Head: Normocephalic.  Eyes: EOM are normal.  Neck: Normal range of motion. Neck supple. No thyromegaly present.  Cardiovascular: Normal rate and regular rhythm.  Respiratory: Effort normal and breath sounds normal. No respiratory distress.  GI: Soft. Bowel sounds are normal. She exhibits no distension.  Neurological: She is alert and oriented to person, place, and time.  Follows simple commands. Perhaps some subtle changes in higher cognitive thinking. Right sided weakness with dysarthria.    Skin: Skin is warm and dry.   No results found for this or any previous visit (from the past 48 hour(s)).  No results found.  Medical Problem List and Plan:  1. Functional deficits secondary to left CVA with right-sided weakness  2. DVT Prophylaxis/Anticoagulation: SCDs. Monitor for any signs of DVT  3. Pain Management: Tylenol as needed  4. Hypertension. Amlodipine 10 mg daily, lisinopril 40 mg daily, metoprolol 50 mg daily, hydrochlorothiazide 12.5 mg daily. Monitor with increased mobility  5. Neuropsych: This patient is capable of making decisions on her own behalf.  6. Skin/Wound Care: Routine skin checks  7. Diabetes mellitus with peripheral neuropathy. Lantus insulin 25 units daily. Check blood sugars a.c. and at bedtime  8. Hyperlipidemia.Atorvastatin 80 mg daily  9. Urinary tract infection. Cipro 500 mg twice a day 07/12/2014 until 07/19/2014. Continue oxybutynin 5 mg one half tablet twice a day for history of urinary incontinence  Post Admission Physician Evaluation:  1. Functional deficits secondary to left internal capsule infarct. 2. Patient is admitted to receive collaborative, interdisciplinary care between the physiatrist, rehab nursing staff, and therapy  team. 3. Patient's level of medical complexity and substantial therapy needs in context of that medical necessity cannot be provided at a lesser intensity of care such as a SNF. 4. Patient has experienced substantial functional loss from his/her baseline which was documented above under the "Functional History" and "Functional Status" headings. Judging by the patient's diagnosis, physical exam, and functional history, the patient has potential for functional progress which will result in measurable gains while on inpatient rehab. These gains will be of substantial and practical use upon discharge in facilitating mobility and self-care at the household level. 5. Physiatrist will provide 24 hour management of medical needs as well as oversight of the therapy plan/treatment and provide guidance as appropriate regarding the interaction of the two. 6. 24 hour rehab nursing will assist with bladder management, bowel management, safety, skin/wound care, disease management, medication administration, pain management and patient education and help integrate therapy concepts, techniques,education, etc. 7. PT will assess and treat for/with: Lower extremity strength, range of motion, stamina, balance, functional mobility, safety, adaptive techniques and equipment, NMR, stroke education, egosupport. Goals are: min assist. 8. OT will assess and treat for/with: ADL's, functional mobility, safety, upper extremity strength, adaptive techniques and equipment, egosupport, leisure awareness, community reintegration. Goals are: min assist. Therapy may proceed with showering this patient. 9. SLP will assess and treat for/with: cognition, communication. Goals are: mod I. 10. Case Management and Social Worker will assess and treat for psychological issues and discharge planning. 11. Team conference will be held weekly to assess progress toward goals and to determine barriers to discharge. 12. Patient will receive at least 3 hours  of therapy per day at least 5 days per week. 13. ELOS: 10-13 days  14. Prognosis: excellent  Ranelle Oyster, MD, [facility] Physical Medicine [facility] Rehabilitation 07/14/2014   07/14/2014

## 2014-07-15 ENCOUNTER — Inpatient Hospital Stay (HOSPITAL_COMMUNITY): Payer: Self-pay | Admitting: Speech Pathology

## 2014-07-15 ENCOUNTER — Inpatient Hospital Stay (HOSPITAL_COMMUNITY): Payer: Medicare PPO

## 2014-07-15 ENCOUNTER — Inpatient Hospital Stay (HOSPITAL_COMMUNITY): Payer: Medicare PPO | Admitting: Occupational Therapy

## 2014-07-15 DIAGNOSIS — Z5189 Encounter for other specified aftercare: Secondary | ICD-10-CM

## 2014-07-15 DIAGNOSIS — I6330 Cerebral infarction due to thrombosis of unspecified cerebral artery: Secondary | ICD-10-CM

## 2014-07-15 LAB — GLUCOSE, CAPILLARY
GLUCOSE-CAPILLARY: 142 mg/dL — AB (ref 70–99)
GLUCOSE-CAPILLARY: 184 mg/dL — AB (ref 70–99)
GLUCOSE-CAPILLARY: 147 mg/dL — AB (ref 70–99)

## 2014-07-15 NOTE — Evaluation (Signed)
Physical Therapy Assessment and Plan  Patient Details  Name: Megan Moore MRN: [id number] Date of Birth: 1941-06-01  PT Diagnosis: Abnormal posture, Cognitive deficits, Coordination disorder, Difficulty walking, Hemiplegia dominant, Hypotonia, Impaired sensation and Muscle weakness, Decreased Balance, Decreased functional endurance Rehab Potential: Good ELOS: 18-21 days   Today's Date: 07/15/2014 PT Individual Time: 1300-1400 PT Individual Time Calculation (min): 60 min    Problem List:  Patient Active Problem List   Diagnosis Date Noted  . CVA (cerebral infarction) 07/14/2014    Past Medical History: No past medical history on file. Past Surgical History: No past surgical history on file.  Assessment & Plan Clinical Impression: 73 year old right-handed female with history of hypertension as well as diabetes mellitus with peripheral neuropathy. Presented to [facility] 07/12/2014 with acute onset of right-sided weakness. Cranial CT scan negative. MRI of the brain showed acute infarct left side of the internal capsule as well as remote lacunar infarct of the thalami bilaterally, the left caudate head and posterior right pons. Patient did not receive TPA. Echocardiogram unremarkable. Carotid Dopplers with no ICA stenosis. Neurology services consulted placed on aspirin for CVA prophylaxis. Hospital course urinary tract infection with Cipro initiated 07/12/2014 with end date 07/19/2014. She is tolerating a regular consistency diet. Physical occupational therapy ongoing patient mod max assist for mobility. Patient transferred to CIR on 07/14/2014.   Patient currently requires max with mobility secondary to muscle weakness, decreased cardiorespiratoy endurance, abnormal tone and decreased coordination, decreased midline orientation and decreased attention to right, decreased attention, decreased awareness, decreased problem solving, decreased safety awareness and decreased  memory and decreased sitting balance, decreased standing balance, decreased postural control, hemiplegia and decreased balance strategies.  Prior to hospitalization, patient was independent  with mobility and lived with Spouse in a House home.  Home access is 6Stairs to enter.  Patient will benefit from skilled PT intervention to maximize safe functional mobility, minimize fall risk and decrease caregiver burden for planned discharge home with 24 hour assist.  Anticipate patient will benefit from follow up [facility] at discharge.  PT - End of Session Activity Tolerance: Tolerates 30+ min activity with multiple rests Endurance Deficit: Yes PT Assessment Rehab Potential: Good PT Patient demonstrates impairments in the following area(s): Balance;Endurance;Motor;Perception;Safety;Sensory;Skin Integrity;Other (comment) (strength) PT Transfers Functional Problem(s): Bed Mobility;Bed to Chair;Car;Furniture PT Locomotion Functional Problem(s): Stairs;Wheelchair Mobility;Ambulation PT Plan PT Intensity: Minimum of 1-2 x/day ,45 to 90 minutes PT Frequency: 5 out of 7 days PT Duration Estimated Length of Stay: 18-21 days PT Treatment/Interventions: Ambulation/gait training;Community reintegration;DME/adaptive equipment instruction;Neuromuscular re-education;Psychosocial support;Stair training;UE/LE Strength taining/ROM;Wheelchair propulsion/positioning;UE/LE Coordination activities;Therapeutic Activities;Skin care/wound management;Pain management;Discharge planning;Balance/vestibular training;Functional electrical stimulation;Cognitive remediation/compensation;Disease management/prevention;Functional mobility training;Patient/family education;Splinting/orthotics;Therapeutic Exercise;Visual/perceptual remediation/compensation PT Transfers Anticipated Outcome(s): Min A PT Locomotion Anticipated Outcome(s): Supervision-min A PT Recommendation Follow Up Recommendations: Home health PT Patient destination:  Home Equipment Recommended: To be determined  Skilled Therapeutic Intervention 1:1. Pt received semi-reclined in bed, ready for therapy with husband at bedise. PT evaluation performed, see detailed objective information below. Pt and husband educated on rehab environment, role of therapies, goals for physical therapy and general safety plan. Pt and husband verbalized understanding. Tx initiated with emphasis on bed mobility, functional transfers and w/c propulsion. Pt req mod A for t/f sup>sit EOB w/ use of hospital bed functions, but max A for t/f sup<>sit EOB w/out HOB elevated and bed rails only. Pt req min A for B rolling w/ use of bed rails req total A clean up due to bowel incontinence. Pt  with decreased emergent awareness regarding body positioning while engaged in dynamic balance EOB due to progressive posterior/R lean. Pt req max A with squat pivot t/f bed>w/c to R side as well as SBT w/c<>tx mat, pt slightly impulsive with extension and pushing R during transfers. Increased safety with use of slideboard, nursing staff aware. Pt req max A for w/c propulsion 70'x1 w/ use of L UE only, L LE unable to touch floor and pt would therefore benefit from hemi-height w/c. Pt left witting in w/c at end of session w/ all needs in reach, quick release belt in place and husband in room.   PT Evaluation Precautions/Restrictions Precautions Precautions: Fall Restrictions Weight Bearing Restrictions: No General Chart Reviewed: Yes Family/Caregiver Present: Yes (Husband John) Vital Signs  Pain Pain Assessment Pain Assessment: No/denies pain Home Living/Prior Functioning Home Living Available Help at Discharge: Family Type of Home: House Home Access: Stairs to enter Secretary/administrator of Steps: 6 Entrance Stairs-Rails: Right;Left Home Layout: One level  Lives With: Spouse Prior Function Level of Independence: Independent with transfers;Independent with gait;Independent with homemaking with  ambulation;Independent with basic ADLs  Able to Take Stairs?: Yes Driving: No 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 Leisure: Hobbies-yes (Comment) Comments: Watching TV Vision/Perception    See OT eval  Cognition Overall Cognitive Status: Impaired/Different from baseline Arousal/Alertness: Awake/alert Orientation Level: Oriented X4 Attention: Sustained;Selective Sustained Attention: Appears intact Selective Attention: Impaired Selective Attention Impairment: Functional basic;Verbal complex Memory: Impaired Memory Impairment: Decreased recall of new information;Decreased short term memory Decreased Short Term Memory: Verbal complex Awareness: Impaired Awareness Impairment: Emergent impairment Problem Solving: Impaired Problem Solving Impairment: Verbal complex;Functional basic Executive Function: Reasoning Reasoning: Impaired Reasoning Impairment: Verbal complex Behaviors: Impulsive Safety/Judgment: Impaired Sensation Sensation Light Touch: Impaired Detail Light Touch Impaired Details: Impaired RUE;Impaired RLE Proprioception: Impaired Detail Proprioception Impaired Details: Impaired RUE;Impaired RLE Coordination Gross Motor Movements are Fluid and Coordinated: No Fine Motor Movements are Fluid and Coordinated: No Motor  Motor Motor: Hemiplegia;Abnormal tone Motor - Skilled Clinical Observations: R LE flaccid; R hemiplegia  Mobility Bed Mobility Bed Mobility: Rolling Left;Rolling Right;Supine to Sit;Sit to Supine Rolling Right: 4: Min assist;With rail Rolling Right Details: Verbal cues for technique;Verbal cues for precautions/safety;Manual facilitation for weight shifting Rolling Left: 4: Min assist;With rail Rolling Left Details: Verbal cues for technique;Verbal cues for precautions/safety;Manual facilitation for weight shifting Supine to Sit: 3: Mod assist;2: Max assist Supine to Sit Details: Manual facilitation for weight shifting;Manual facilitation  for placement;Verbal cues for technique;Verbal cues for sequencing;Verbal cues for precautions/safety;Tactile cues for weight shifting;Tactile cues for placement Sit to Supine: 2: Max assist Sit to Supine - Details: Tactile cues for placement;Verbal cues for precautions/safety;Verbal cues for technique;Verbal cues for sequencing;Manual facilitation for placement Transfers Transfers: Yes Squat Pivot Transfers: 2: Max assist Squat Pivot Transfer Details: Verbal cues for technique;Verbal cues for sequencing;Tactile cues for weight shifting;Tactile cues for placement;Tactile cues for sequencing;Manual facilitation for weight shifting;Verbal cues for safe use of DME/AE;Verbal cues for precautions/safety Squat Pivot Transfer Details (indicate cue type and reason): slightly impulsive, pt demonstrates trunk extension and pushing to R Lateral/Scoot Transfers: With slide board;2: Max assist Lateral/Scoot Transfer Details: Tactile cues for placement;Tactile cues for sequencing;Verbal cues for safe use of DME/AE;Verbal cues for technique;Verbal cues for precautions/safety;Verbal cues for sequencing;Tactile cues for weight shifting;Manual facilitation for weight shifting Lateral/Scoot Transfer Details (indicate cue type and reason): slightly impulsive, pt demonstrates trunk extension and pushing to R Locomotion  Ambulation Ambulation: No (Unsafe/unable to attempt at time of eval) Stairs /  Additional Locomotion Stairs: No (Unsafe/unable to attempt at time of eval) Wheelchair Mobility Wheelchair Mobility: Yes Wheelchair Assistance: 2: Max Technical sales engineer Details: Tactile cues for placement;Verbal cues for technique;Verbal cues for sequencing;Tactile cues for sequencing Wheelchair Propulsion: Left upper extremity Wheelchair Parts Management: Needs assistance Distance: 57'  Trunk/Postural Assessment  Cervical Assessment Cervical Assessment: Within Functional Limits Thoracic Assessment Thoracic  Assessment: Exceptions to [facility] (kyphotic) Lumbar Assessment Lumbar Assessment: Exceptions to [facility] (posterior pelvic tilt) Postural Control Postural Control: Deficits on evaluation Trunk Control: very weak on R side, noted progressive posterior/R lean in static sitting Righting Reactions: delayed in sitting req A for correction Protective Responses: delayed  Balance Balance Balance Assessed: Yes Static Sitting Balance Static Sitting - Balance Support: Left upper extremity supported;Feet supported Static Sitting - Level of Assistance: 4: Min assist Static Sitting - Comment/# of Minutes: pt w/ progressive posterior/R lean Dynamic Sitting Balance Dynamic Sitting - Balance Support: Left upper extremity supported;Feet supported Dynamic Sitting - Level of Assistance: 2: Max assist;3: Mod assist Dynamic Sitting Balance - Compensations: pt w/ progressive posterior/R lean, quickly exacerbated when reaching w/ L UE Dynamic Sitting - Balance Activities: Lateral lean/weight shifting;Forward lean/weight shifting;Reaching across midline Extremity Assessment      RLE Assessment RLE Assessment: Exceptions to [facility] RLE Strength Right Hip Flexion: 1/5 Right Knee Flexion: 0/5 Right Knee Extension: 0/5 Right Ankle Dorsiflexion: 0/5 Right Ankle Plantar Flexion: 0/5 RLE Tone RLE Tone: Flaccid LLE Assessment LLE Assessment: Within Functional Limits (Grossly 4+/5)  FIM:  FIM - Bed/Chair Transfer Bed/Chair Transfer Assistive Devices: HOB elevated;Arm rests;Bed rails;Sliding board Bed/Chair Transfer: 2: Supine > Sit: Max A (lifting assist/Pt. 25-49%);2: Bed > Chair or W/C: Max A (lift and lower assist);2: Chair or W/C > Bed: Max A (lift and lower assist) FIM - Locomotion: Wheelchair Distance: 70' Locomotion: Wheelchair: 2: Travels 50 - 149 ft with maximal assistance (Pt: 25 - 49%) FIM - Locomotion: Ambulation Locomotion: Ambulation: 0: Activity did not occur FIM - Locomotion: Stairs Locomotion:  Stairs: 0: Activity did not occur   Refer to Care Plan for Long Term Goals  Recommendations for other services: None  Discharge Criteria: Patient will be discharged from PT if patient refuses treatment 3 consecutive times without medical reason, if treatment goals not met, if there is a change in medical status, if patient makes no progress towards goals or if patient is discharged from hospital.  The above assessment, treatment plan, treatment alternatives and goals were discussed and mutually agreed upon: by patient and by family  Gilmore Laroche 07/15/2014, 4:05 PM

## 2014-07-15 NOTE — Evaluation (Signed)
Speech Language Pathology Assessment and Plan  Patient Details  Name: Megan Moore MRN: [id number] Date of Birth: [birth year]  SLP Diagnosis: Cognitive Impairments  Rehab Potential: Good ELOS: 7-10 days for speech only     Today's Date: 07/15/2014 SLP Individual Time: 1100-1200 SLP Individual Time Calculation (min): 60 min   Problem List:  Patient Active Problem List   Diagnosis Date Noted  . CVA (cerebral infarction) 07/14/2014   Past Medical History: No past medical history on file. Past Surgical History: No past surgical history on file.  Assessment / Plan / Recommendation Clinical Impression   73 year old right-handed female presented to [facility] 07/12/2014 with acute onset of right-sided weakness. Cranial CT scan negative. MRI of the brain showed acute infarct left side of the internal capsule as well as remote lacunar infarct of the thalami bilaterally, the left caudate head and posterior right pons. Patient did not receive TPA. She is tolerating a regular consistency diet. Patient was admitted for comprehensive rehabilitation program 07/14/2014 with SLP evaluation completed 07/15/2014 Pt presents with overall mild cognitive impairments characterized by decreased selective attention, working memory, and decreased mental flexibility which affect her ability to effectively problem solve for high-level tasks.  Pt and pt's husband endorse fluctuating cognition at baseline which would be related to changes in pt's blood sugar levels with pt experiencing bouts of confusion with high blood pressure events.  Pt was independently managing her medications at baseline.  Pt reports feeling "slower" since this stroke and presents with delayed processing which appears to precipitate many of the abovementioned cognitive impairments.   Pt would benefit from brief ST follow up 3x/week while inpatient to target family education related to compensatory strategies in order to  maximize functional independence and reduce burden of care upon discharge.    Skilled Therapeutic Interventions          Cognitive-linguistic evaluation completed with results and recommendations reviewed with pt and family.     SLP Assessment  Patient will need skilled [place] Pathology Services during CIR admission    Recommendations  Patient destination: Home Follow up Recommendations: Other (comment) (TBD) Equipment Recommended: None recommended by SLP    SLP Frequency 3 out of 7 days   SLP Treatment/Interventions Cognitive remediation/compensation;Patient/family education;Therapeutic Activities;Internal/external aids;Environmental controls;Cueing hierarchy    Pain Pain Assessment Pain Assessment: No/denies pain Prior Functioning Cognitive/Linguistic Baseline: Within functional limits Type of Home: House  Lives With: Spouse Available Help at Discharge: Family Education: 11th grade  Vocation: Retired  Industrial/product designer Term Goals: Week 1: SLP Short Term Goal 1 (Week 1): Pt will improve selective attention to complete functional tasks in a moderately distracting environment for 7-10 minutes with supervision cues to redirect.  SLP Short Term Goal 2 (Week 1): Pt wiil improve semi-complex problem solving for medication management for 80% accuracy with supervision. SLP Short Term Goal 3 (Week 1): Pt will improve use of external aids to facilitate improved recall of new, complex information for 80% accuracy with supervision   See FIM for current functional status Refer to Care Plan for Long Term Goals  Recommendations for other services: None  Discharge Criteria: Patient will be discharged from SLP if patient refuses treatment 3 consecutive times without medical reason, if treatment goals not met, if there is a change in medical status, if patient makes no progress towards goals or if patient is discharged from hospital.  The above assessment, treatment plan, treatment alternatives  and goals were discussed and mutually agreed upon: by patient  and by family  Megan Moore, M.A. CCC-SLP  Megan Moore, Selinda Orion 07/15/2014, 12:53 PM

## 2014-07-15 NOTE — Progress Notes (Signed)
Occupational Therapy Assessment and Plan  Patient Details  Name: Megan Moore MRN: [id number] Date of Birth: 1941/07/30  OT Diagnosis: cognitive deficits, disturbance of vision and hemiplegia affecting dominant side Rehab Potential: Rehab Potential: Good (for stated goals) ELOS: 18-21 days   Today's Date: 07/15/2014 OT Individual Time: 8937-3428 OT Individual Time Calculation (min): 60 min     Problem List:  Patient Active Problem List   Diagnosis Date Noted  . CVA (cerebral infarction) 07/14/2014    Past Medical History: No past medical history on file. Past Surgical History: No past surgical history on file.  Assessment & Plan Clinical Impression: Patient is a 73 y.o. year old female right-handed female with history of hypertension as well as diabetes mellitus with peripheral neuropathy. Presented to [facility] 07/12/2014 with acute onset of right-sided weakness. Cranial CT scan negative. MRI of the brain showed acute infarct left side of the internal capsule as well as remote lacunar infarct of the thalami bilaterally, the left caudate head and posterior right pons. Patient did not receive TPA. Echocardiogram unremarkable. Carotid Dopplers with no ICA stenosis. Neurology services consulted placed on aspirin for CVA prophylaxis. Hospital course urinary tract infection with Cipro initiated 07/12/2014 with end date 07/19/2014. Patient transferred to CIR on 07/14/2014 .    Patient currently requires mod - total with basic self-care skills secondary to muscle weakness, abnormal tone, decreased coordination and decreased motor planning, R inattention, decreased awareness, decreased problem solving, decreased safety awareness and decreased memory and decreased sitting balance, decreased standing balance, decreased postural control, hemiplegia and decreased balance strategies.  Prior to hospitalization, patient could complete ADLs and IADLs with independent .  Patient will  benefit from skilled intervention to decrease level of assist with basic self-care skills prior to discharge home with care partner.  Anticipate patient will require 24 hour supervision and minimal physical assistance and follow up with [facility] vs outpatient OT.      Skilled Therapeutic Intervention OT evaluation initiated and completed this session. OT educated pt on OT purpose, POC, and goals with patient acceptance. ADLs performed seated EOB with session. OT session with focus on STS, ADL retraining, safety awareness, and dynamic sitting balance. Pt seated EOB for 35 minutes with sitting balance Mod- Max A secondary to leaning to R and posterior lean. Pt requiring verbal cues and assist for forwards leaning. STS with Max A for LB dressing. Pt was able to engage zipper with R hand during session. R side was 2-/5 throughout. Pt attempting to use to assist with ADL session.   OT Evaluation Precautions/Restrictions  Precautions Precautions: Fall Restrictions Weight Bearing Restrictions: No Pain Pain Assessment Pain Assessment: No/denies pain Home Living/Prior Functioning Home Living Living Arrangements: Spouse/significant other Available Help at Discharge: Family Type of Home: House Home Access: Stairs to enter Technical brewer of Steps: 6 Entrance Stairs-Rails: Right;Left Home Layout: One level  Lives With: Spouse IADL History Homemaking Responsibilities: Yes Meal Prep Responsibility: Primary Laundry Responsibility: Primary Education: 11th grade  Prior Function Level of Independence: Independent with transfers;Independent with gait;Independent with homemaking with ambulation;Independent with basic ADLs  Able to Take Stairs?: Yes Driving: No Vocation: Retired Biomedical scientist: CNA at [employer]: Hobbies-yes (Comment) Comments: TV Vision/Perception  Vision- History Baseline Vision/History: Wears glasses Wears Glasses: At all times Patient Visual Report: No change  from baseline  Cognition Overall Cognitive Status: Impaired/Different from baseline Arousal/Alertness: Awake/alert Orientation Level: Oriented X4 Attention: Selective Selective Attention: Impaired Selective Attention Impairment: Verbal complex Memory: Impaired Memory Impairment:  Decreased recall of new information;Decreased short term memory Decreased Short Term Memory: Verbal complex Awareness: Impaired Awareness Impairment: Emergent impairment Problem Solving: Impaired Problem Solving Impairment: Verbal complex Executive Function: Reasoning Reasoning: Impaired Reasoning Impairment: Verbal complex Safety/Judgment: Appears intact Sensation Sensation Light Touch: Impaired Detail Light Touch Impaired Details: Impaired RUE;Impaired RLE Hot/Cold: Impaired Detail Hot/Cold Impaired Details: Impaired RUE;Impaired RLE Proprioception: Impaired Detail Proprioception Impaired Details: Impaired RUE;Impaired RLE Coordination Gross Motor Movements are Fluid and Coordinated: No Fine Motor Movements are Fluid and Coordinated: No Motor  Motor Motor: Hemiplegia;Abnormal tone Motor - Skilled Clinical Observations: R LE flaccid; R hemiplegia Mobility  Bed Mobility Bed Mobility: Rolling Left;Rolling Right;Supine to Sit;Sit to Supine Rolling Right: 4: Min assist;With rail Rolling Right Details: Verbal cues for technique;Verbal cues for precautions/safety;Manual facilitation for weight shifting Rolling Left: 4: Min assist;With rail Rolling Left Details: Verbal cues for technique;Verbal cues for precautions/safety;Manual facilitation for weight shifting Supine to Sit: 2: Max assist Supine to Sit Details: Manual facilitation for weight shifting;Manual facilitation for placement;Verbal cues for technique;Verbal cues for sequencing;Verbal cues for precautions/safety;Tactile cues for weight shifting;Tactile cues for placement Sit to Supine: 2: Max assist Sit to Supine - Details: Tactile cues for  placement;Verbal cues for precautions/safety;Verbal cues for technique;Verbal cues for sequencing;Manual facilitation for placement  Trunk/Postural Assessment  Cervical Assessment Cervical Assessment: Within Functional Limits Thoracic Assessment Thoracic Assessment: Exceptions to [facility] (kyphotic) Lumbar Assessment Lumbar Assessment: Exceptions to [facility] (posterior pelvic tilt) Postural Control Postural Control: Deficits on evaluation Trunk Control: Mod A while seated on EOB for ADLs, verbal cues for forward leaning secondary to R and posterior lean Righting Reactions: delayed in sitting req A for correction Protective Responses: delayed  Balance Balance Balance Assessed: Yes Static Sitting Balance Static Sitting - Balance Support: Left upper extremity supported;Feet supported Static Sitting - Level of Assistance: 4: Min assist Static Sitting - Comment/# of Minutes: pt w/ progressive posterior/R lean Dynamic Sitting Balance Dynamic Sitting - Balance Support: Feet supported Dynamic Sitting - Level of Assistance: 3: Mod assist;2: Max assist Dynamic Sitting Balance - Compensations: pt w/ progressive posterior/R lean, quickly exacerbated when reaching w/ L UE Dynamic Sitting - Balance Activities:  (bathing and dressing seat on EOB) Extremity/Trunk Assessment RUE Assessment RUE Assessment: Within Functional Limits (2-/5 throughout, PROM is WFLs) LUE Assessment LUE Assessment: Within Functional Limits  FIM:  FIM - Grooming Grooming Steps: Wash, rinse, dry face;Wash, rinse, dry hands;Oral care, brush teeth, clean dentures Grooming: 4: Patient completes 3 of 4 or 4 of 5 steps FIM - Bathing Bathing Steps Patient Completed: Chest;Right Arm;Abdomen;Front perineal area;Right upper leg;Left upper leg Bathing: 3: Mod-Patient completes 5-7 44f 10 parts or 50-74% FIM - Upper Body Dressing/Undressing Upper body dressing/undressing steps patient completed: Thread/unthread left sleeve of pullover  shirt/dress Upper body dressing/undressing: 2: Max-Patient completed 25-49% of tasks FIM - Lower Body Dressing/Undressing Lower body dressing/undressing steps patient completed: Thread/unthread left underwear leg;Thread/unthread left pants leg Lower body dressing/undressing: 1: Total-Patient completed less than 25% of tasks FIM - Toileting Toileting: 0: Activity did not occur FIM - Radio producer Devices: Bedside commode Toilet Transfers: 2-To toilet/BSC: Max A (lift and lower assist);2-From toilet/BSC: Max A (lift and lower assist) FIM - Tub/Shower Transfers Tub/shower Transfers: 0-Activity did not occur or was simulated   Refer to Care Plan for Long Term Goals  Recommendations for other services: Neuropsych and Other: RT  Discharge Criteria: Patient will be discharged from OT if patient refuses treatment 3 consecutive times without medical reason, if treatment  goals not met, if there is a change in medical status, if patient makes no progress towards goals or if patient is discharged from hospital.  The above assessment, treatment plan, treatment alternatives and goals were discussed and mutually agreed upon: by patient  Phineas Semen 07/15/2014, 12:37 PM

## 2014-07-15 NOTE — Plan of Care (Signed)
Problem: RH BOWEL ELIMINATION Goal: RH STG MANAGE BOWEL WITH ASSISTANCE STG Manage Bowel with min Assistance.  Outcome: Not Progressing incont this a.m after laxative

## 2014-07-15 NOTE — Progress Notes (Signed)
Megan Moore is a 73 y.o. female 23-Nov-1940 [id number]  Subjective: No new complaints. No new problems. Slept well. Feeling OK.  Objective: Vital signs in last 24 hours: Temp:  [98.3 F (36.8 C)-98.9 F (37.2 C)] 98.3 F (36.8 C) (09/12 0617) Pulse Rate:  [60-71] 60 (09/12 0617) Resp:  [16-18] 18 (09/12 0617) BP: (138-160)/(64-83) 144/70 mmHg (09/12 0617) SpO2:  [100 %] 100 % (09/12 0617) Weight change:  Last BM Date: 07/12/14  Intake/Output from previous day: 09/11 0701 - 09/12 0700 In: 360 [P.O.:360] Out: -   Physical Exam General: No apparent distress   In BSC, pleasant - RUE weakness unchanged Lungs: Normal effort. Lungs clear to auscultation, no crackles or wheezes. Cardiovascular: Regular rate and rhythm, no edema  Lab Results: BMET No results found for this basename: na, k, cl, co2, glucose, bun, creatinine, calcium, gfrnonaa, gfraa   CBC No results found for this basename: wbc, rbc, hgb, hct, plt, mcv, mch, mchc, rdw, neutrabs, lymphsabs, monoabs, eosabs, basosabs   CBG's (last 3):   Recent Labs  07/14/14 1617 07/14/14 2112 07/15/14 0739  GLUCAP 145* 153* 142*   LFT's No results found for this basename: ALT, AST, GGT, ALKPHOS, BILITOT    Studies/Results: No results found.  Medications:  I have reviewed the patient's current medications. Scheduled Medications: . amLODipine  10 mg Oral Daily  . aspirin  81 mg Oral Daily  . atorvastatin  80 mg Oral q1800  . ciprofloxacin  500 mg Oral BID  . hydrochlorothiazide  12.5 mg Oral Daily  . insulin aspart  0-5 Units Subcutaneous QHS  . insulin aspart  0-9 Units Subcutaneous TID WC  . insulin glargine  25 Units Subcutaneous Daily  . lisinopril  40 mg Oral Daily  . metoprolol tartrate  50 mg Oral Daily  . oxybutynin  2.5 mg Oral BID   PRN Medications: acetaminophen, ondansetron (ZOFRAN) IV, ondansetron, sorbitol  Assessment/Plan: Active Problems:   CVA (cerebral infarction)   1. Functional  deficits secondary to left CVA with right-sided weakness  2. DVT Prophylaxis/Anticoagulation: SCDs. Monitor for any signs of DVT  3. Pain Management: Tylenol as needed  4. Hypertension. Amlodipine 10 mg daily, lisinopril 40 mg daily, metoprolol 50 mg daily, hydrochlorothiazide 12.5 mg daily. Monitor with increased mobility  5. Neuropsych: This patient is capable of making decisions on her own behalf.  6. Skin/Wound Care: Routine skin checks  7. Diabetes mellitus with peripheral neuropathy. Lantus insulin 25 units daily. Check blood sugars a.c. and at bedtime  8. Hyperlipidemia. Atorvastatin 80 mg daily  9. Urinary tract infection. Cipro 500 mg twice a day 07/12/2014 until 07/19/2014. Continue oxybutynin 5 mg one half tablet twice a day for history of urinary incontinence     Length of stay, days: 1  Manpreet Strey A. Felicity Coyer, MD 07/15/2014, 12:03 PM

## 2014-07-16 LAB — GLUCOSE, CAPILLARY
GLUCOSE-CAPILLARY: 160 mg/dL — AB (ref 70–99)
GLUCOSE-CAPILLARY: 112 mg/dL — AB (ref 70–99)
GLUCOSE-CAPILLARY: 204 mg/dL — AB (ref 70–99)
GLUCOSE-CAPILLARY: 120 mg/dL — AB (ref 70–99)
Glucose-Capillary: 112 mg/dL — ABNORMAL HIGH (ref 70–99)

## 2014-07-16 NOTE — Progress Notes (Signed)
Megan Moore is a 73 y.o. female 09-04-1941 [id number]  Subjective: No new complaints. No new problems. Slept well. Feeling OK.  Objective: Vital signs in last 24 hours: Temp:  [97.8 F (36.6 C)-98.2 F (36.8 C)] 98.2 F (36.8 C) (09/13 0607) Pulse Rate:  [66-69] 69 (09/13 0858) Resp:  [18] 18 (09/13 0607) BP: (129-144)/(63-69) 129/69 mmHg (09/13 0858) SpO2:  [100 %] 100 % (09/13 0607) Weight change:  Last BM Date: 07/15/14  Intake/Output from previous day: 09/12 0701 - 09/13 0700 In: 240 [P.O.:240] Out: -   Physical Exam General: No apparent distress   In Bed, pleasant - RUE weakness unchanged Lungs: Normal effort. Lungs clear to auscultation, no crackles or wheezes. Cardiovascular: Regular rate and rhythm, no edema  Lab Results: BMET No results found for this basename: na,  k,  cl,  co2,  glucose,  bun,  creatinine,  calcium,  gfrnonaa,  gfraa   CBC No results found for this basename: wbc,  rbc,  hgb,  hct,  plt,  mcv,  mch,  mchc,  rdw,  neutrabs,  lymphsabs,  monoabs,  eosabs,  basosabs   CBG's (last 3):    Recent Labs  07/15/14 1657 07/15/14 2129 07/16/14 0728  GLUCAP 147* 160* 112*   LFT's No results found for this basename: ALT,  AST,  GGT,  ALKPHOS,  BILITOT    Studies/Results: No results found.  Medications:  I have reviewed the patient's current medications. Scheduled Medications: . amLODipine  10 mg Oral Daily  . aspirin  81 mg Oral Daily  . atorvastatin  80 mg Oral q1800  . ciprofloxacin  500 mg Oral BID  . hydrochlorothiazide  12.5 mg Oral Daily  . insulin aspart  0-5 Units Subcutaneous QHS  . insulin aspart  0-9 Units Subcutaneous TID WC  . insulin glargine  25 Units Subcutaneous Daily  . lisinopril  40 mg Oral Daily  . metoprolol tartrate  50 mg Oral Daily  . oxybutynin  2.5 mg Oral BID   PRN Medications: acetaminophen, ondansetron (ZOFRAN) IV, ondansetron, sorbitol  Assessment/Plan: Active Problems:   CVA (cerebral  infarction)   1. Functional deficits secondary to left CVA with right-sided weakness  2. DVT Prophylaxis/Anticoagulation: SCDs. Monitor for any signs of DVT  3. Pain Management: Tylenol as needed  4. Hypertension. Amlodipine 10 mg daily, lisinopril 40 mg daily, metoprolol 50 mg daily, hydrochlorothiazide 12.5 mg daily. Monitor with increased mobility  5. Neuropsych: This patient is capable of making decisions on her own behalf.  6. Skin/Wound Care: Routine skin checks  7. Diabetes mellitus with peripheral neuropathy. Lantus insulin 25 units daily. Check blood sugars a.c. and at bedtime  8. Hyperlipidemia. Atorvastatin 80 mg daily  9. Urinary tract infection. Cipro 500 mg twice a day 07/12/2014 until 07/19/2014. Continue oxybutynin 5 mg one half tablet twice a day for history of urinary incontinence     Length of stay, days: 2  Valerie A. Felicity Coyer, MD 07/16/2014, 10:10 AM

## 2014-07-17 ENCOUNTER — Inpatient Hospital Stay (HOSPITAL_COMMUNITY): Payer: Medicare PPO

## 2014-07-17 ENCOUNTER — Inpatient Hospital Stay (HOSPITAL_COMMUNITY): Payer: Medicare PPO | Admitting: Occupational Therapy

## 2014-07-17 DIAGNOSIS — Z5189 Encounter for other specified aftercare: Secondary | ICD-10-CM

## 2014-07-17 DIAGNOSIS — I6330 Cerebral infarction due to thrombosis of unspecified cerebral artery: Secondary | ICD-10-CM

## 2014-07-17 DIAGNOSIS — G8110 Spastic hemiplegia affecting unspecified side: Secondary | ICD-10-CM

## 2014-07-17 LAB — CBC WITH DIFFERENTIAL/PLATELET
BASOS ABS: 0.1 10*3/uL (ref 0.0–0.1)
BASOS PCT: 1 % (ref 0–1)
EOS ABS: 0.5 10*3/uL (ref 0.0–0.7)
EOS PCT: 8 % — AB (ref 0–5)
HEMATOCRIT: 37.7 % (ref 36.0–46.0)
HEMOGLOBIN: 12.6 g/dL (ref 12.0–15.0)
Lymphocytes Relative: 20 % (ref 12–46)
Lymphs Abs: 1.3 10*3/uL (ref 0.7–4.0)
MCH: 29.6 pg (ref 26.0–34.0)
MCHC: 33.4 g/dL (ref 30.0–36.0)
MCV: 88.7 fL (ref 78.0–100.0)
MONO ABS: 0.6 10*3/uL (ref 0.1–1.0)
MONOS PCT: 10 % (ref 3–12)
NEUTROS ABS: 3.9 10*3/uL (ref 1.7–7.7)
Neutrophils Relative %: 61 % (ref 43–77)
Platelets: 255 10*3/uL (ref 150–400)
RBC: 4.25 MIL/uL (ref 3.87–5.11)
RDW: 12.5 % (ref 11.5–15.5)
WBC: 6.3 10*3/uL (ref 4.0–10.5)

## 2014-07-17 LAB — GLUCOSE, CAPILLARY
GLUCOSE-CAPILLARY: 168 mg/dL — AB (ref 70–99)
Glucose-Capillary: 126 mg/dL — ABNORMAL HIGH (ref 70–99)
Glucose-Capillary: 146 mg/dL — ABNORMAL HIGH (ref 70–99)
Glucose-Capillary: 122 mg/dL — ABNORMAL HIGH (ref 70–99)

## 2014-07-17 LAB — COMPREHENSIVE METABOLIC PANEL
ALBUMIN: 3.1 g/dL — AB (ref 3.5–5.2)
ALT: 13 U/L (ref 0–35)
ANION GAP: 17 — AB (ref 5–15)
AST: 18 U/L (ref 0–37)
Alkaline Phosphatase: 89 U/L (ref 39–117)
BILIRUBIN TOTAL: 0.5 mg/dL (ref 0.3–1.2)
BUN: 33 mg/dL — AB (ref 6–23)
CALCIUM: 9 mg/dL (ref 8.4–10.5)
CO2: 19 mEq/L (ref 19–32)
CREATININE: 1.12 mg/dL — AB (ref 0.50–1.10)
Chloride: 100 mEq/L (ref 96–112)
GFR calc Af Amer: 55 mL/min — ABNORMAL LOW (ref >90)
GFR calc non Af Amer: 48 mL/min — ABNORMAL LOW (ref >90)
Glucose, Bld: 199 mg/dL — ABNORMAL HIGH (ref 70–99)
Potassium: 4.1 mEq/L (ref 3.7–5.3)
Sodium: 136 mEq/L — ABNORMAL LOW (ref 137–147)
TOTAL PROTEIN: 7.1 g/dL (ref 6.0–8.3)

## 2014-07-17 NOTE — Plan of Care (Signed)
Problem: RH BLADDER ELIMINATION Goal: RH STG MANAGE BLADDER WITH ASSISTANCE STG Manage Bladder With min Assistance  Outcome: Not Progressing Patient wears brief and is incontinent. Needs work with timed toileting during the day.  Problem: RH SAFETY Goal: RH STG ADHERE TO SAFETY PRECAUTIONS W/ASSISTANCE/DEVICE STG Adhere to Safety Precautions With min Assistance/Device.  Outcome: Progressing Patient needs occasional reminding to call for assistance

## 2014-07-17 NOTE — Progress Notes (Signed)
Subjective/Complaints: 73 year old right-handed female with history of hypertension as well as diabetes mellitus with peripheral neuropathy. Presented to [facility] 07/12/2014 with acute onset of right-sided weakness. Cranial CT scan negative. MRI of the brain showed acute infarct left side of the internal capsule as well as remote lacunar infarct of the thalami bilaterally, the left caudate head and posterior right pons. Patient did not receive TPA. Echocardiogram unremarkable. Carotid Dopplers with no ICA stenosis. Neurology services consulted placed on aspirin for CVA prophylaxis. Hospital course urinary tract infection with Cipro initiated 07/12/2014 with end date 07/19/2014  Review of Systems - Negative except onset of weakness about 1 mo prior to admit  Objective: Vital Signs: Blood pressure 113/63, pulse 65, temperature 98 F (36.7 C), temperature source Oral, resp. rate 18, SpO2 100.00%. No results found. Results for orders placed during the hospital encounter of 07/14/14 (from the past 72 hour(s))  GLUCOSE, CAPILLARY     Status: Abnormal   Collection Time    07/14/14  4:17 PM      Result Value Ref Range   Glucose-Capillary 145 (*) 70 - 99 mg/dL  GLUCOSE, CAPILLARY     Status: Abnormal   Collection Time    07/14/14  9:12 PM      Result Value Ref Range   Glucose-Capillary 153 (*) 70 - 99 mg/dL   Comment 1 Notify RN    GLUCOSE, CAPILLARY     Status: Abnormal   Collection Time    07/15/14  7:39 AM      Result Value Ref Range   Glucose-Capillary 142 (*) 70 - 99 mg/dL   Comment 1 Notify RN    GLUCOSE, CAPILLARY     Status: Abnormal   Collection Time    07/15/14 11:15 AM      Result Value Ref Range   Glucose-Capillary 184 (*) 70 - 99 mg/dL  GLUCOSE, CAPILLARY     Status: Abnormal   Collection Time    07/15/14  4:57 PM      Result Value Ref Range   Glucose-Capillary 147 (*) 70 - 99 mg/dL  GLUCOSE, CAPILLARY     Status: Abnormal   Collection Time   07/15/14  9:29 PM      Result Value Ref Range   Glucose-Capillary 160 (*) 70 - 99 mg/dL  GLUCOSE, CAPILLARY     Status: Abnormal   Collection Time    07/16/14  7:28 AM      Result Value Ref Range   Glucose-Capillary 112 (*) 70 - 99 mg/dL  GLUCOSE, CAPILLARY     Status: Abnormal   Collection Time    07/16/14 11:32 AM      Result Value Ref Range   Glucose-Capillary 112 (*) 70 - 99 mg/dL   Comment 1 Notify RN    GLUCOSE, CAPILLARY     Status: Abnormal   Collection Time    07/16/14  4:59 PM      Result Value Ref Range   Glucose-Capillary 204 (*) 70 - 99 mg/dL   Comment 1 Notify RN    GLUCOSE, CAPILLARY     Status: Abnormal   Collection Time    07/16/14  9:28 PM      Result Value Ref Range   Glucose-Capillary 120 (*) 70 - 99 mg/dL  GLUCOSE, CAPILLARY     Status: Abnormal   Collection Time    07/17/14  7:13 AM      Result Value Ref Range   Glucose-Capillary 126 (*) 70 -  99 mg/dL     HEENT: normal Cardio: RRR and no murmur Resp: CTA B/L and unlabored GI: BS positive and NT Extremity:  Pulses positive and No Edema Skin:   Intact and Other dry Neuro: Alert/Oriented, Normal Sensory, Abnormal Motor 2- R delt, bi, tri grip, 0/5 RLE except trace knee ext and Reflexes: 3+ Musc/Skel:  Other no pain with R shoulder AAROM Gen NAD   Assessment/Plan: 1. 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 Physiatrist is providing close team supervision and 24 hour management of active medical problems listed below. Physiatrist and rehab team continue to assess barriers to discharge/monitor patient progress toward functional and medical goals. FIM: FIM - Bathing Bathing Steps Patient Completed: Chest;Right Arm;Abdomen Bathing: 2: Max-Patient completes 3-4 39f 10 parts or 25-49%  FIM - Upper Body Dressing/Undressing Upper body dressing/undressing  steps patient completed: Thread/unthread left sleeve of pullover shirt/dress;Put head through opening of pull over shirt/dress Upper body dressing/undressing: 3: Mod-Patient completed 50-74% of tasks FIM - Lower Body Dressing/Undressing Lower body dressing/undressing steps patient completed: Thread/unthread left underwear leg;Thread/unthread left pants leg Lower body dressing/undressing: 1: Total-Patient completed less than 25% of tasks  FIM - Toileting Toileting: 1: Two helpers  FIM - Diplomatic Services operational officer Devices: Therapist, music Transfers: 1-Two helpers  FIM - Banker Devices: HOB elevated;Arm rests;Bed rails;Sliding board Bed/Chair Transfer: 2: Supine > Sit: Max A (lifting assist/Pt. 25-49%);2: Bed > Chair or W/C: Max A (lift and lower assist);2: Chair or W/C > Bed: Max A (lift and lower assist)  FIM - Locomotion: Wheelchair Distance: 70' Locomotion: Wheelchair: 2: Travels 50 - 149 ft with maximal assistance (Pt: 25 - 49%) FIM - Locomotion: Ambulation Locomotion: Ambulation: 0: Activity did not occur  Comprehension Comprehension Mode: Auditory Comprehension: 5-Follows basic conversation/direction: With extra time/assistive device  Expression Expression Mode: Verbal Expression: 5-Expresses basic needs/ideas: With extra time/assistive device  Social Interaction Social Interaction: 4-Interacts appropriately 75 - 89% of the time - Needs redirection for appropriate language or to initiate interaction.  Problem Solving Problem Solving: 3-Solves basic 50 - 74% of the time/requires cueing 25 - 49% of the time  Memory Memory: 3-Recognizes or recalls 50 - 74% of the time/requires cueing 25 - 49% of the time   1. Functional deficits secondary to left CVA with right-sided weakness  2. DVT Prophylaxis/Anticoagulation: SCDs. Monitor for any signs of DVT  3. Pain Management: Tylenol as needed  4. Hypertension. Amlodipine  10 mg daily, lisinopril 40 mg daily, metoprolol 50 mg daily, hydrochlorothiazide 12.5 mg daily. Monitor with increased mobility  5. Neuropsych: This patient is capable of making decisions on her own behalf.  6. Skin/Wound Care: Routine skin checks  7. Diabetes mellitus with peripheral neuropathy. Lantus insulin 25 units daily. Check blood sugars a.c. and at bedtime  8. Hyperlipidemia. Atorvastatin 80 mg daily  9. Urinary tract infection. Cipro 500 mg twice a day 07/12/2014 until 07/19/2014. Continue oxybutynin 5 mg one half tablet twice a day for history of urinary incontinence LOS (Days) 3 A FACE TO FACE EVALUATION WAS PERFORMED  KIRSTEINS,ANDREW E 07/17/2014, 7:57 AM

## 2014-07-17 NOTE — Care Management Note (Signed)
[facility] Individual Statement of Services  Patient Name:  Megan Moore  Date:  07/17/2014  Welcome to the [facility].  Our goal is to provide you with an individualized program based on your diagnosis and situation, designed to meet your specific needs.  With this comprehensive rehabilitation program, you will be expected to participate in at least 3 hours of rehabilitation therapies Monday-Friday, with modified therapy programming on the weekends.  Your rehabilitation program will include the following services:  Physical Therapy (PT), Occupational Therapy (OT), Speech Therapy (ST), 24 hour per day rehabilitation nursing, Case Management (Social Worker), Rehabilitation Medicine, Nutrition Services and Pharmacy Services  Weekly team conferences will be held on Wednesday to discuss your progress.  Your Social Worker will talk with you frequently to get your input and to update you on team discussions.  Team conferences with you and your family in attendance may also be held.  Expected length of stay: 18-21 days Overall anticipated outcome: min level  Depending on your progress and recovery, your program may change. Your Social Worker will coordinate services and will keep you informed of any changes. Your Social Worker's name and contact numbers are listed  below.  The following services may also be recommended but are not provided by the [facility]patient [facility]:   Driving Evaluations  Home Health Rehabiltiation Services  Outpatient Rehabilitation Services    Arrangements will be made to provide these services after discharge if needed.  Arrangements include referral to agencies that provide these services.  Your insurance has been verified to be:  [employer] Your primary doctor is:  MD in [place]  Pertinent information will be shared with your doctor and your insurance company.  Social Worker:  Dossie Der, SW [phone]  or [phone]  Information discussed with and copy given to patient by: Lucy Chris, 07/17/2014, 10:53 AM

## 2014-07-17 NOTE — Progress Notes (Signed)
Social Work Assessment and Plan Social Work Assessment and Plan  Patient Details  Name: Megan Moore MRN: [id number] Date of Birth: 06-Nov-1940  Today's Date: 07/17/2014  Problem List:  Patient Active Problem List   Diagnosis Date Noted  . CVA (cerebral infarction) 07/14/2014   Past Medical History: No past medical history on file. Past Surgical History: No past surgical history on file. Social History:  has no tobacco, alcohol, and drug history on file.  Family / Support Systems Marital Status: Married Patient Roles: Spouse;Parent Spouse/Significant Other: Jonny Ruiz  [phone]  [phone] Children: Sue Lush gray-daughter  [phone] Other Supports: Dwayne-son  [phone] Anticipated Caregiver: Husband but unsure at this time Ability/Limitations of Caregiver: husband has some health issues and children work Medical laboratory scientific officer: Other (Comment) (coming up with a plan) Family Dynamics: Close knit family who are very supportive of one another and will be there and assist when needed.  Pt reports: " My dauhgter will quit her job for me."  Social History Preferred language: English Religion: Christian Reformed Cultural Background: No issues Education: [employer] Read: Yes Write: Yes Employment Status: Retired Fish farm manager Issues: No issues Guardian/Conservator: None-according to MD pt is capable of making her own decisions while here   Abuse/Neglect Physical Abuse: Denies Verbal Abuse: Denies Sexual Abuse: Denies Exploitation of patient/patient's resources: Denies Self-Neglect: Denies  Emotional Status [name] affect, behavior adn adjustment status: Pt is motivated to improve she relies upon her faith to pull her through.  She has always been independent and cared for others this is unsual for her.  She is praying about it and her goal is to get back home and not have to go to a nursing home.  She needs to be able to move her right side then she wil be  in business. Recent Psychosocial Issues: Other health issues that were managed, until this Pyschiatric History: No history deferred depression due to pt coping appropriately and relies upon her strong faith.  Will monitor and intervene if needed.   Substance Abuse History: No issues  Patient / Family Perceptions, Expectations & Goals Pt/Family understanding of illness & functional limitations: Pt and husband have a basic understanding of her stroke and deficits.  They are both hopeful she will do well here and get to a level where he can manage her at home.  Encouraged him to attend therapies with wife and see her progress.  He will try to do this soon. Premorbid pt/family roles/activities: Wife, Mother, grandmother, church member, retiree, Catering manager Anticipated changes in roles/activities/participation: resume Pt/family expectations/goals: Pt states; " I want to get independent again, I have some work to do."  " I don't want to go to a nursing home."  Husband states: " We are hopeful she will do well here."  [employer]: None Premorbid Home Care/DME Agencies: None Transportation available at discharge: [employer] referrals recommended: Support group (specify) (CVA Support group)  Discharge Planning Living Arrangements: Spouse/significant other Support Systems: Spouse/significant other;Children;Friends/neighbors;Church/faith community;Other relatives Type of Residence: Private residence Insurance Resources: Media planner (specify) ([employer] Medicare) Financial Resources: Restaurant manager, fast food Screen Referred: No Living Expenses: Lives with family Money Management: Patient;Spouse Does the patient have any problems obtaining your medications?: No Home Management: Both of them Patient/Family Preliminary Plans: Return home with husband providing care if he is able to do so.  They are discussing with family members what each can provide and assist with at  discharge.  Await team's goals. Social Work Anticipated Follow Up Needs: HH/OP;Support Group  Clinical Impression Pleasant female who is motivated to improve and regain her independence.  She doesn't want to burden her family with her care.  According to team she will require physical care at discharge. Will get with family in coming up with a realistic plan.  Husband is in good health and is willing to assist.  Sujay Grundman, Lemar Livings 07/17/2014, 11:26 AM

## 2014-07-17 NOTE — Progress Notes (Signed)
Physical Therapy Session Note  Patient Details  Name: Megan Moore MRN: [id number] Date of Birth: 19-Feb-1941  Today's Date: 07/17/2014 PT Individual Time:0800-0900 1500-1530 PT Individual Time Calculation (min): 60 min, 30 min   Short Term Goals: Week 1:  PT Short Term Goal 1 (Week 1): Pt to perform bed mobility with mod Ax1person without use of hospital bed functions PT Short Term Goal 2 (Week 1): Pt to perform bed<>w/c transfers with mod Ax1person PT Short Term Goal 3 (Week 1): Pt to maintain dynamic sitting balance with min Ax1person PT Short Term Goal 4 (Week 1): Pt to propel w/c 75' with min Ax1person PT Short Term Goal 5 (Week 1): Pt to ambulate 10' with LRAD and Ax2persons  Skilled Therapeutic Interventions/Progress Updates:  tx 1:   focused on neuro re-ed via demo, manual and VCs ffor RLE heel slides, marching in sitting on Kinetron at 60 cm/sec; bed mobility, transfers and attempted tanding in //  In standing in //, pt unable to bring wt over feet, leaning posteriorly and to R heavily.  Pt returned to room; quick release belt applied and all needs within reach.  tx 2:  Neuro re-ed for midline orientation in supported sitting, EOB, bed mobility, sit> stand pulling up on sturdy chair.  L and R lateral leans and forward leans, returning to midline with cues.  Rolling L with min assist, and sidelying> sitting with mod assist.  Pt pushes backwards and to R with L hand if allowed.  Resting position in posterior pelvic tilt.   In standing, pt is unable to extend R knee, and trunk falls heavily to R. With max assist for posture, pt maintained standing x 30 seconds.  Sit> supine with max assist.  Bed alarm set , needs within reach, and family present.     Therapy Documentation Precautions:  Precautions Precautions: Fall; pt pushes to hemiplegic side with intact LUE and LLE Restrictions Weight Bearing Restrictions: No Pain: Pain Assessment Pain Assessment: No/denies  pain Mobility:     Other Treatments:    See FIM for current functional status  Therapy/Group: Individual Therapy  Sedrick Tober 07/17/2014, 4:42 PM

## 2014-07-17 NOTE — Progress Notes (Signed)
Patient information reviewed and entered into eRehab system by Dnaiel Voller, RN, CRRN, PPS Coordinator.  Information including medical coding and functional independence measure will be reviewed and updated through discharge.     Per nursing patient was given "Data Collection Information Summary for Patients in Inpatient Rehabilitation Facilities with attached "Privacy Act Statement-Health Care Records" upon admission.  

## 2014-07-17 NOTE — Progress Notes (Signed)
Speech Language Pathology Daily Session Note  Patient Details  Name: Megan Moore MRN: [id number] Date of Birth: 1941-08-01  Today's Date: 07/17/2014 SLP Individual Time: 4098-1191 SLP Individual Time Calculation (min): 45 min  Short Term Goals: Week 1: SLP Short Term Goal 1 (Week 1): Pt will improve selective attention to complete functional tasks in a moderately distracting environment for 7-10 minutes with supervision cues to redirect.  SLP Short Term Goal 2 (Week 1): Pt wiil improve semi-complex problem solving for medication management for 80% accuracy with supervision. SLP Short Term Goal 3 (Week 1): Pt will improve use of external aids to facilitate improved recall of new, complex information for 80% accuracy with supervision   Skilled Therapeutic Interventions: Skilled treatment focused on cognitive goals. SLP facilitated session by providing Mod multimodal cueing for basic problem solving in order to utilize her schedule as an external memory aid. Pt required Mod cues to identify key information and to perform basic math calculations related to timing of events. Pt independently recalled the function of her three medications from PTA, recognizing two by name. Pt required Total A for recall of new medications since admission, with written list created to be utilized as an external memory aid. Continue plan of care.   FIM:  Comprehension Comprehension Mode: Auditory Comprehension: 5-Follows basic conversation/direction: With extra time/assistive device Expression Expression Mode: Verbal Expression: 5-Expresses basic 90% of the time/requires cueing < 10% of the time. Social Interaction Social Interaction: 4-Interacts appropriately 75 - 89% of the time - Needs redirection for appropriate language or to initiate interaction. Problem Solving Problem Solving: 3-Solves basic 50 - 74% of the time/requires cueing 25 - 49% of the time Memory Memory: 3-Recognizes or recalls 50 - 74% of the  time/requires cueing 25 - 49% of the time  Pain Pain Assessment Pain Assessment: No/denies pain  Therapy/Group: Individual Therapy   Maxcine Ham, M.A. CCC-SLP [phone]  Maxcine Ham 07/17/2014, 2:50 PM

## 2014-07-17 NOTE — IPOC Note (Signed)
Overall Plan of Care [facility]) Patient Details Name: Megan Moore MRN: [id number] DOB: September 24, 1941  Admitting Diagnosis: L CVA  Hospital Problems: Active Problems:   CVA (cerebral infarction)     Functional Problem List: Nursing Bladder;Endurance;Motor;Medication Management;Safety;Bowel  PT Balance;Endurance;Motor;Perception;Safety;Sensory;Skin Integrity;Other (comment) (strength)  OT Balance;Cognition;Endurance;Motor;Safety;Sensory;Vision  SLP Cognition  TR         Basic ADL's: OT Grooming;Eating;Bathing;Dressing;Toileting     Advanced  ADL's: OT Simple Meal Preparation     Transfers: PT Bed Mobility;Bed to Chair;Car;Furniture  OT Toilet;Tub/Shower     Locomotion: PT Stairs;Wheelchair Mobility;Ambulation     Additional Impairments: OT Fuctional Use of Upper Extremity  SLP Social Cognition   Memory;Attention;Awareness  TR      Anticipated Outcomes Item Anticipated Outcome  Self Feeding    Swallowing      Basic self-care  S- Min A  Toileting  Min A   Bathroom Transfers Min A  Bowel/Bladder  mod I  Transfers  Min A  Locomotion  Supervision-min A  Communication     Cognition  supervision   Pain  < 3  Safety/Judgment  Supervision   Therapy Plan: PT Intensity: Minimum of 1-2 x/day ,45 to 90 minutes PT Frequency: 5 out of 7 days PT Duration Estimated Length of Stay: 18-21 days OT Intensity: Minimum of 1-2 x/day, 45 to 90 minutes OT Frequency: 5 out of 7 days OT Duration/Estimated Length of Stay: 18-21 days SLP Intensity: Minumum of 1-2 x/day, 30 to 90 minutes SLP Frequency: 3 out of 7 days SLP Duration/Estimated Length of Stay: 7-10 days for speech only        Team Interventions: Nursing Interventions Patient/Family Education;Bladder Management;Bowel Management;Disease Management/Prevention;Medication Management;Cognitive Remediation/Compensation;Discharge Planning  PT interventions Ambulation/gait training;Community reintegration;DME/adaptive  equipment instruction;Neuromuscular re-education;Psychosocial support;Stair training;UE/LE Strength taining/ROM;Wheelchair propulsion/positioning;UE/LE Coordination activities;Therapeutic Activities;Skin care/wound management;Pain management;Discharge planning;Balance/vestibular training;Functional electrical stimulation;Cognitive remediation/compensation;Disease management/prevention;Functional mobility training;Patient/family education;Splinting/orthotics;Therapeutic Exercise;Visual/perceptual remediation/compensation  OT Interventions Balance/vestibular training;Cognitive remediation/compensation;Discharge planning;DME/adaptive equipment instruction;Functional mobility training;Psychosocial support;Therapeutic Activities;UE/LE Strength taining/ROM;Visual/perceptual remediation/compensation;UE/LE Coordination activities;Therapeutic Exercise;Self Care/advanced ADL retraining;Patient/family education;Neuromuscular re-education  SLP Interventions Cognitive remediation/compensation;Patient/family education;Therapeutic Activities;Internal/external aids;Environmental controls;Cueing hierarchy  TR Interventions    SW/CM Interventions      Team Discharge Planning: Destination: PT-Home ,OT- Home , SLP-Home Projected Follow-up: PT-Home health PT, OT-  Other (comment) (HHOT vs outpatient OT services), SLP-Other (comment) (TBD) Projected Equipment Needs: PT-To be determined, OT- 3 in 1 bedside comode;Tub/shower bench, SLP-None recommended by SLP Equipment Details: PT- , OT-  Patient/family involved in discharge planning: PT- Patient;Family member/caregiver,  OT-Patient, SLP-Patient;Family member/caregiver  MD ELOS: 15-20d Medical Rehab Prognosis:  Good Assessment: 73 year old right-handed female with history of hypertension as well as diabetes mellitus with peripheral neuropathy. [facility]ed [facility]12[facility]2014 with acute onset of right-sided weakness. Cranial CT scan negative. MRI of  the brain showed acute infarct left side of the internal capsule as well as remote lacunar infarct of the thalami bilaterally, the left caudate head and posterior right pons.   Now requiring 24/7 Rehab RN,MD, as well as CIR level PT, OT and SLP.  Treatment team will focus on ADLs and mobility with goals set at minA/Sup   See Team Conference Notes for weekly updates to the plan of care

## 2014-07-17 NOTE — Progress Notes (Signed)
Occupational Therapy Session Note  Patient Details  Name: Megan Moore MRN: [id number] Date of Birth: 18-Aug-1941  Today's Date: 07/17/2014 OT Individual Time: 4098-1191 OT Individual Time Calculation (min): 45 min    Short Term Goals: Week 1:  OT Short Term Goal 1 (Week 1): Pt will perform LB dressing with Max A in order to increase I with self care OT Short Term Goal 2 (Week 1): Pt will perform shower transfer with Mod A in order to increase I in functional transfers. OT Short Term Goal 3 (Week 1): Pt will perform toilet transfer with Mod A in order to increase I in functional transfers.  OT Short Term Goal 4 (Week 1): Pt will perform toileting with Mod A in order to increase I with self care. OT Short Term Goal 5 (Week 1): Pt perform UB dressing with Min A in order to increase I in self care.   Skilled Therapeutic Interventions/Progress Updates:    Pt declined bathing and dressing, stating that nursing gave her a good bath in bed and she did not need to change clothes. Pt was eager to go to gym to work on her R arm. Pt stated that she could not move her arm at all. When prompted, pt was able to slightly move her 1st 3 fingers. Pt was in w/c and taken to gym. She requires +2 transfer as she pushes posteriorly with her trunk and pushes her legs out.  Once on mat, pt worked on postural alignment and awareness with small forward and lateral leans.  She tends to move "big" and those movements easily throw her off balance. She was able to maintain static sitting with min a while her R arm positioned on tray table for towel slides. With tapping, pt able to move elbow in and out 50% of range and demonstrated increased finger active movement. Pt provided with w/c lap tray.  Pt taken back to room and transferred to bed with +2, R arm positioned on pillow with pt education on arm positioning. Bed alarm set and call light in reach.  Therapy Documentation Precautions:  Precautions Precautions:  Fall Restrictions Weight Bearing Restrictions: No    Pain: Pain Assessment Pain Assessment: No/denies pain  ADL: See FIM for current functional status  Therapy/Group: Individual Therapy  Mashawn Brazil 07/17/2014, 9:29 AM

## 2014-07-18 ENCOUNTER — Inpatient Hospital Stay (HOSPITAL_COMMUNITY): Payer: Medicare PPO | Admitting: *Deleted

## 2014-07-18 ENCOUNTER — Inpatient Hospital Stay (HOSPITAL_COMMUNITY): Payer: Medicare PPO

## 2014-07-18 DIAGNOSIS — I6330 Cerebral infarction due to thrombosis of unspecified cerebral artery: Secondary | ICD-10-CM

## 2014-07-18 DIAGNOSIS — Z5189 Encounter for other specified aftercare: Secondary | ICD-10-CM

## 2014-07-18 LAB — GLUCOSE, CAPILLARY
GLUCOSE-CAPILLARY: 137 mg/dL — AB (ref 70–99)
GLUCOSE-CAPILLARY: 155 mg/dL — AB (ref 70–99)
Glucose-Capillary: 136 mg/dL — ABNORMAL HIGH (ref 70–99)
Glucose-Capillary: 145 mg/dL — ABNORMAL HIGH (ref 70–99)
Glucose-Capillary: 173 mg/dL — ABNORMAL HIGH (ref 70–99)

## 2014-07-18 NOTE — Progress Notes (Signed)
Nurse called to Rehab gym at 850, patient c/o lightheaded and dizzy, nausea and felt like she was going to "pass out." Vitals taken, b/p 120/105 at 855, 139/74 at 857. CBG checked for 137. Malissa Hippo, PA was present during patient episode. Patient verbalized feeling a little better, complaints of dizziness still. Will continue to monitor. Pete Merten, Phill Mutter

## 2014-07-18 NOTE — Progress Notes (Addendum)
Physical Therapy Session Note  Patient Details  Name: Megan Moore MRN: [id number] Date of Birth: Apr 02, 1941  Today's Date: 07/18/2014 PT Individual Time:  9:00-10:05 ( )  And 13:16-14:15 ( )   Short Term Goals: Week 1:  PT Short Term Goal 1 (Week 1): Pt to perform bed mobility with mod Ax1person without use of hospital bed functions PT Short Term Goal 2 (Week 1): Pt to perform bed<>w/c transfers with mod Ax1person PT Short Term Goal 3 (Week 1): Pt to maintain dynamic sitting balance with min Ax1person PT Short Term Goal 4 (Week 1): Pt to propel w/c 75' with min Ax1person PT Short Term Goal 5 (Week 1): Pt to ambulate 10' with LRAD and Ax2persons  Skilled Therapeutic Interventions/Progress Updates:  First tx focused on bed mobility, transfers, visual tasks, standing frame and NMR to reduce pushing tendencies and increase R-sided activation. Pt received in gym with OT, c/o feeling going to "pass out," and dizzy. Nurse working up vitals and blood sugar with no imbalances note. Pt agreeable to continue therapy, but did have significant orthostatic changes during standing only, but not supine>sit.   Assessed vision more in-depth with increased difficulty noted tracking and sustaining gaze to in both R quadrants. Instructed pt in R/L rolling x3 each with S to R and min A to L with safety cues for UE and LE manamgement. Pt spent laying on R/L sides for increased proprioception and stable end-point to reduce pushing to R.   Performed unsupported sitting, dynamic reaching; sitting on wedge under L pelvis for increased midline orientation and reduce pushing during reaching and R-sided attention tasks.   Instructed pt in lateral scooting along mat ro R/L with max A and manual facilitation for anterior weight shift; LUE in lap.   Instructed pt in squat-pivot with Max A to R and L mat>WC and WC>bed with Bobath method for reducted pushing, increased flexion.   Standing frame x3 min with  R-sided attention tasks. Orthostatic changes noted, so returned pt to sitting with LEs elevated.  Pt left in bed with family present and all needs in reach. BP back to WNL.  Second tx focused on transfer training, standing for midline orientation, and NMR via forced use, manual facilitation, and multi-modal cues. Family present for tx this afternoon. Staff changing pt's room for therapeutic increase in R-sided stimulation.   Assisted pt supine>sit with Max A for LE and trunk lifting. Instructed pt in squat pivot Bobath method for reduced pushing to hemi-WC: bed>WC>mat>WC with Max A overall.   Instructed pt in hemi-technique WC propulsion with min A for steering to avoid obstacles on R-side. Pt limited by fatigue, needed cues for technique and stroke efficiency.   Seated NMR at mirror for visual feedback:  - reaching/placing cones from bench, across midline with LUE, but using RUE to push return to midline - forward leaning onto forearms on low blue bench, with lifting hips off mat x6, max A for midline - latearl scooting along mat with max manual facilitation for anterior translation.   Standing NMR at mirror  - +2 assist standing x58min with cues for hip ext and reaching, sliding LUE along mirror for trunk ext (LLE placed on blue disom for target to stay put) - +1 assist standing x52min at // bars with mirror for visual cues, and assist for hip ext. \ No orthostatic changes this afternoon with new thigh-high Teds  Pt left up in [facility] with lap belt, family present, and all needs in reach.  Therapy Documentation Precautions:  Precautions Precautions: Fall Restrictions Weight Bearing Restrictions: No General:   Vital Signs: Therapy Vitals Temp: 97.7 F (36.5 C) Temp src: Oral Pulse Rate: 77 Resp: 18 BP: 107/68 mmHg Patient Position (if appropriate): Lying Oxygen Therapy SpO2: 100 % O2 Device: None (Room air) Pain: none and none       Other Treatments:    See FIM for  current functional status  Therapy/Group: Individual Therapy  Juniper Cobey, Chrisandra Netters, PT, DPT  07/18/2014, 8:54 AM

## 2014-07-18 NOTE — Progress Notes (Signed)
Occupational Therapy Session Note  Patient Details  Name: Megan Moore MRN: [id number] Date of Birth: 10-30-1941  Today's Date: 07/18/2014 OT Individual Time: 0800-0900 OT Individual Time Calculation (min): 60 min    Short Term Goals: Week 1:  OT Short Term Goal 1 (Week 1): Pt will perform LB dressing with Max A in order to increase I with self care OT Short Term Goal 2 (Week 1): Pt will perform shower transfer with Mod A in order to increase I in functional transfers. OT Short Term Goal 3 (Week 1): Pt will perform toilet transfer with Mod A in order to increase I in functional transfers.  OT Short Term Goal 4 (Week 1): Pt will perform toileting with Mod A in order to increase I with self care. OT Short Term Goal 5 (Week 1): Pt perform UB dressing with Min A in order to increase I in self care.   Skilled Therapeutic Interventions/Progress Updates:    Pt resting in bed upon arrival and agreeable to therapy.  Pt declined bathing (stating nursing already "cleaned" her up) but agreeable to changing clothing.  Pt required max A for squat pivot transfer (to right) to w/c.  Pt required max verbal cues for hemi dressing techniques for pullover shirt.  Pt required tot A + 2 for standing at sink to pull up pants.  Pt transitioned to therapy gym and engaged in dynamic sitting tasks with focus on reaching forward and to right/left while maintaining balance.  Pt required min A to maintain sitting balance.  Pt also engage in weight bearing through RUE while reaching to right.  Near the end of the session patient stated she felt like she was going to "pass out." Pt assisted to supine and patient stated she thought she was going to vomit.  Pt assisted to supported sitting and RN notified.  RN arrived and obtained vitals.  Pt remained sitting with no further complaints. PT arrived to continue therapy with patient.  Focus on hemi dressing techniques, sitting balance, transfers, activity tolerance, and safety  awareness.  Therapy Documentation Precautions:  Precautions Precautions: Fall Restrictions Weight Bearing Restrictions: No Pain: Pain Assessment Pain Assessment: No/denies pain  See FIM for current functional status  Therapy/Group: Individual Therapy  Rich Brave 07/18/2014, 9:03 AM

## 2014-07-18 NOTE — Progress Notes (Signed)
Subjective/Complaints: 73 year old right-handed female with history of hypertension as well as diabetes mellitus with peripheral neuropathy. Presented to [facility] 07/12/2014 with acute onset of right-sided weakness. Cranial CT scan negative. MRI of the brain showed acute infarct left side of the internal capsule as well as remote lacunar infarct of the thalami bilaterally, the left caudate head and posterior right pons. Patient did not receive TPA. Echocardiogram unremarkable. Carotid Dopplers with no ICA stenosis. Neurology services consulted placed on aspirin for CVA prophylaxis. Hospital course urinary tract infection with Cipro initiated 07/12/2014 with end date 07/19/2014  Worked hard in PT/OT yesterday "I want to walk"  Review of Systems - Negative except onset of weakness about 1 mo prior to admit  Objective: Vital Signs: Blood pressure 107/68, pulse 77, temperature 97.7 F (36.5 C), temperature source Oral, resp. rate 18, SpO2 100.00%. No results found. Results for orders placed during the hospital encounter of 07/14/14 (from the past 72 hour(s))  GLUCOSE, CAPILLARY     Status: Abnormal   Collection Time    07/15/14  7:39 AM      Result Value Ref Range   Glucose-Capillary 142 (*) 70 - 99 mg/dL   Comment 1 Notify RN    GLUCOSE, CAPILLARY     Status: Abnormal   Collection Time    07/15/14 11:15 AM      Result Value Ref Range   Glucose-Capillary 184 (*) 70 - 99 mg/dL  GLUCOSE, CAPILLARY     Status: Abnormal   Collection Time    07/15/14  4:57 PM      Result Value Ref Range   Glucose-Capillary 147 (*) 70 - 99 mg/dL  GLUCOSE, CAPILLARY     Status: Abnormal   Collection Time    07/15/14  9:29 PM      Result Value Ref Range   Glucose-Capillary 160 (*) 70 - 99 mg/dL  GLUCOSE, CAPILLARY     Status: Abnormal   Collection Time    07/16/14  7:28 AM      Result Value Ref Range   Glucose-Capillary 112 (*) 70 - 99 mg/dL  GLUCOSE, CAPILLARY     Status:  Abnormal   Collection Time    07/16/14 11:32 AM      Result Value Ref Range   Glucose-Capillary 112 (*) 70 - 99 mg/dL   Comment 1 Notify RN    GLUCOSE, CAPILLARY     Status: Abnormal   Collection Time    07/16/14  4:59 PM      Result Value Ref Range   Glucose-Capillary 204 (*) 70 - 99 mg/dL   Comment 1 Notify RN    GLUCOSE, CAPILLARY     Status: Abnormal   Collection Time    07/16/14  9:28 PM      Result Value Ref Range   Glucose-Capillary 120 (*) 70 - 99 mg/dL  GLUCOSE, CAPILLARY     Status: Abnormal   Collection Time    07/17/14  7:13 AM      Result Value Ref Range   Glucose-Capillary 126 (*) 70 - 99 mg/dL  CBC WITH DIFFERENTIAL     Status: Abnormal   Collection Time    07/17/14  8:14 AM      Result Value Ref Range   WBC 6.3  4.0 - 10.5 K/uL   RBC 4.25  3.87 - 5.11 MIL/uL   Hemoglobin 12.6  12.0 - 15.0 g/dL   HCT 37.7  36.0 - 46.0 %   MCV 88.7  78.0 - 100.0 fL   MCH 29.6  26.0 - 34.0 pg   MCHC 33.4  30.0 - 36.0 g/dL   RDW 12.5  11.5 - 15.5 %   Platelets 255  150 - 400 K/uL   Neutrophils Relative % 61  43 - 77 %   Neutro Abs 3.9  1.7 - 7.7 K/uL   Lymphocytes Relative 20  12 - 46 %   Lymphs Abs 1.3  0.7 - 4.0 K/uL   Monocytes Relative 10  3 - 12 %   Monocytes Absolute 0.6  0.1 - 1.0 K/uL   Eosinophils Relative 8 (*) 0 - 5 %   Eosinophils Absolute 0.5  0.0 - 0.7 K/uL   Basophils Relative 1  0 - 1 %   Basophils Absolute 0.1  0.0 - 0.1 K/uL  COMPREHENSIVE METABOLIC PANEL     Status: Abnormal   Collection Time    07/17/14  8:14 AM      Result Value Ref Range   Sodium 136 (*) 137 - 147 mEq/L   Potassium 4.1  3.7 - 5.3 mEq/L   Chloride 100  96 - 112 mEq/L   CO2 19  19 - 32 mEq/L   Glucose, Bld 199 (*) 70 - 99 mg/dL   BUN 33 (*) 6 - 23 mg/dL   Creatinine, Ser 1.12 (*) 0.50 - 1.10 mg/dL   Calcium 9.0  8.4 - 10.5 mg/dL   Total Protein 7.1  6.0 - 8.3 g/dL   Albumin 3.1 (*) 3.5 - 5.2 g/dL   AST 18  0 - 37 U/L   ALT 13  0 - 35 U/L   Alkaline Phosphatase 89  39 - 117  U/L   Total Bilirubin 0.5  0.3 - 1.2 mg/dL   GFR calc non Af Amer 48 (*) >90 mL/min   GFR calc Af Amer 55 (*) >90 mL/min   Comment: (NOTE)     The eGFR has been calculated using the CKD EPI equation.     This calculation has not been validated in all clinical situations.     eGFR's persistently <90 mL/min signify possible Chronic Kidney     Disease.   Anion gap 17 (*) 5 - 15  GLUCOSE, CAPILLARY     Status: Abnormal   Collection Time    07/17/14 11:19 AM      Result Value Ref Range   Glucose-Capillary 146 (*) 70 - 99 mg/dL  GLUCOSE, CAPILLARY     Status: Abnormal   Collection Time    07/17/14  5:15 PM      Result Value Ref Range   Glucose-Capillary 122 (*) 70 - 99 mg/dL  GLUCOSE, CAPILLARY     Status: Abnormal   Collection Time    07/17/14  9:10 PM      Result Value Ref Range   Glucose-Capillary 168 (*) 70 - 99 mg/dL  GLUCOSE, CAPILLARY     Status: Abnormal   Collection Time    07/18/14  6:31 AM      Result Value Ref Range   Glucose-Capillary 137 (*) 70 - 99 mg/dL     HEENT: normal Cardio: RRR and no murmur Resp: CTA B/L and unlabored GI: BS positive and NT Extremity:  Pulses positive and No Edema Skin:   Intact and Other dry Neuro: Alert/Oriented, Normal Sensory, Abnormal Motor 2- R delt, bi, tri grip, 0/5 RLE except trace knee ext and Reflexes: 3+ Musc/Skel:  Other no pain with R shoulder AAROM Gen NAD  Assessment/Plan: 1. 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 Physiatrist is providing close team supervision and 24 hour management of active medical problems listed below. Physiatrist and rehab team continue to assess barriers to discharge/monitor patient progress toward functional and medical goals. FIM: FIM - Bathing Bathing Steps Patient Completed: Chest;Right Arm;Abdomen Bathing: 2: Max-Patient completes 3-4 85f 10 parts or 25-49%  FIM - Upper Body Dressing/Undressing Upper body dressing/undressing steps patient completed: Thread/unthread left sleeve of pullover shirt/dress;Put head through opening of pull over shirt/dress Upper body dressing/undressing: 3: Mod-Patient completed 50-74% of tasks FIM - Lower Body Dressing/Undressing Lower body dressing/undressing steps patient completed: Thread/unthread left underwear leg;Thread/unthread left pants leg Lower body dressing/undressing: 1: Total-Patient completed less than 25% of tasks  FIM - Toileting Toileting: 1: Two helpers  FIM - TRadio producerDevices: GProduct managerTransfers: 1-Two helpers  FIM - BFinancial plannerTransfer: 3: Chair or W/C > Bed: Mod A (lift or lower assist)  FIM - Locomotion: Wheelchair Distance: 70' Locomotion: Wheelchair: 1: Total Assistance/staff pushes wheelchair (Pt<25%) FIM - Locomotion: Ambulation Locomotion: Ambulation: 0: Activity did not occur  Comprehension Comprehension Mode: Auditory Comprehension: 5-Follows basic conversation/direction: With extra time/assistive device  Expression Expression Mode: Verbal Expression: 5-Expresses basic needs/ideas: With extra time/assistive device  Social Interaction Social Interaction: 4-Interacts appropriately 75 - 89% of the time - Needs redirection for appropriate language or to initiate interaction.  Problem Solving Problem Solving: 4-Solves basic 75 - 89% of the time/requires cueing 10 - 24% of the time  Memory Memory: 3-Recognizes or recalls 50 - 74% of the time/requires cueing 25 - 49% of the time   1. Functional deficits secondary to left CVA with right-sided weakness  2. DVT Prophylaxis/Anticoagulation: SCDs. Monitor for any signs of DVT  3. Pain Management: Tylenol as needed  4. Hypertension. Amlodipine 10 mg daily, lisinopril 40 mg daily, metoprolol 50 mg  daily, hydrochlorothiazide 12.5 mg daily.BP on low side, elevated BUN will hold HCTZ Monitor with increased mobility  5. Neuropsych: This patient is capable of making decisions on her own behalf.  6. Skin/Wound Care: Routine skin checks  7. Diabetes mellitus with peripheral neuropathy. Lantus insulin 25 units daily. Check blood sugars a.c. and at bedtime  8. Hyperlipidemia. Atorvastatin 80 mg daily  9. Urinary tract infection. Cipro 500 mg twice a day 07/12/2014 until 07/19/2014. Continue oxybutynin 5 mg one half tablet twice a day for history of urinary incontinence LOS (Days) 4 A FACE TO FACE EVALUATION WAS PERFORMED  Sylvan Sookdeo E 07/18/2014, 7:37 AM

## 2014-07-19 ENCOUNTER — Inpatient Hospital Stay (HOSPITAL_COMMUNITY): Payer: Medicare PPO

## 2014-07-19 ENCOUNTER — Inpatient Hospital Stay (HOSPITAL_COMMUNITY): Payer: Medicare PPO | Admitting: Speech Pathology

## 2014-07-19 ENCOUNTER — Inpatient Hospital Stay (HOSPITAL_COMMUNITY): Payer: Medicare PPO | Admitting: Occupational Therapy

## 2014-07-19 LAB — GLUCOSE, CAPILLARY
GLUCOSE-CAPILLARY: 187 mg/dL — AB (ref 70–99)
Glucose-Capillary: 106 mg/dL — ABNORMAL HIGH (ref 70–99)
Glucose-Capillary: 141 mg/dL — ABNORMAL HIGH (ref 70–99)
Glucose-Capillary: 152 mg/dL — ABNORMAL HIGH (ref 70–99)

## 2014-07-19 LAB — BASIC METABOLIC PANEL
ANION GAP: 15 (ref 5–15)
BUN: 46 mg/dL — ABNORMAL HIGH (ref 6–23)
CALCIUM: 9 mg/dL (ref 8.4–10.5)
CO2: 21 mEq/L (ref 19–32)
CREATININE: 1.36 mg/dL — AB (ref 0.50–1.10)
Chloride: 102 mEq/L (ref 96–112)
GFR, EST AFRICAN AMERICAN: 44 mL/min — AB (ref >90)
GFR, EST NON AFRICAN AMERICAN: 38 mL/min — AB (ref >90)
Glucose, Bld: 106 mg/dL — ABNORMAL HIGH (ref 70–99)
Potassium: 3.8 mEq/L (ref 3.7–5.3)
Sodium: 138 mEq/L (ref 137–147)

## 2014-07-19 MED ORDER — SENNOSIDES-DOCUSATE SODIUM 8.6-50 MG PO TABS
2.0000 | ORAL_TABLET | Freq: Every day | ORAL | Status: DC
  Administered 2014-07-19 – 2014-08-03 (×8): 2 via ORAL
  Filled 2014-07-19 (×12): qty 2

## 2014-07-19 MED ORDER — BETHANECHOL CHLORIDE 25 MG PO TABS
25.0000 mg | ORAL_TABLET | Freq: Three times a day (TID) | ORAL | Status: DC
  Administered 2014-07-19 – 2014-07-24 (×17): 25 mg via ORAL
  Filled 2014-07-19 (×21): qty 1

## 2014-07-19 NOTE — Progress Notes (Signed)
Subjective/Complaints: 73 year old right-handed female with history of hypertension as well as diabetes mellitus with peripheral neuropathy. Presented to [facility] 07/12/2014 with acute onset of right-sided weakness. Cranial CT scan negative. MRI of the brain showed acute infarct left side of the internal capsule as well as remote lacunar infarct of the thalami bilaterally, the left caudate head and posterior right pons. Patient did not receive TPA. Echocardiogram unremarkable. Carotid Dopplers with no ICA stenosis. Neurology services consulted placed on aspirin for CVA prophylaxis. Hospital course urinary tract infection with Cipro initiated 07/12/2014 with end date 07/19/2014  Working with speech therapy on medication management. No complaints this morning. Sleeping well at night. "I want to walk"  Review of Systems - Negative except onset of weakness about 1 mo prior to admit  Objective: Vital Signs: Blood pressure 112/62, pulse 66, temperature 98.2 F (36.8 C), temperature source Oral, resp. rate 18, SpO2 99.00%. No results found. Results for orders placed during the hospital encounter of 07/14/14 (from the past 72 hour(s))  GLUCOSE, CAPILLARY     Status: Abnormal   Collection Time    07/16/14 11:32 AM      Result Value Ref Range   Glucose-Capillary 112 (*) 70 - 99 mg/dL   Comment 1 Notify RN    GLUCOSE, CAPILLARY     Status: Abnormal   Collection Time    07/16/14  4:59 PM      Result Value Ref Range   Glucose-Capillary 204 (*) 70 - 99 mg/dL   Comment 1 Notify RN    GLUCOSE, CAPILLARY     Status: Abnormal   Collection Time    07/16/14  9:28 PM      Result Value Ref Range   Glucose-Capillary 120 (*) 70 - 99 mg/dL  GLUCOSE, CAPILLARY     Status: Abnormal   Collection Time    07/17/14  7:13 AM      Result Value Ref Range   Glucose-Capillary 126 (*) 70 - 99 mg/dL  CBC WITH DIFFERENTIAL     Status: Abnormal   Collection Time    07/17/14  8:14 AM   Result Value Ref Range   WBC 6.3  4.0 - 10.5 K/uL   RBC 4.25  3.87 - 5.11 MIL/uL   Hemoglobin 12.6  12.0 - 15.0 g/dL   HCT 37.7  36.0 - 46.0 %   MCV 88.7  78.0 - 100.0 fL   MCH 29.6  26.0 - 34.0 pg   MCHC 33.4  30.0 - 36.0 g/dL   RDW 12.5  11.5 - 15.5 %   Platelets 255  150 - 400 K/uL   Neutrophils Relative % 61  43 - 77 %   Neutro Abs 3.9  1.7 - 7.7 K/uL   Lymphocytes Relative 20  12 - 46 %   Lymphs Abs 1.3  0.7 - 4.0 K/uL   Monocytes Relative 10  3 - 12 %   Monocytes Absolute 0.6  0.1 - 1.0 K/uL   Eosinophils Relative 8 (*) 0 - 5 %   Eosinophils Absolute 0.5  0.0 - 0.7 K/uL   Basophils Relative 1  0 - 1 %   Basophils Absolute 0.1  0.0 - 0.1 K/uL  COMPREHENSIVE METABOLIC PANEL     Status: Abnormal   Collection Time    07/17/14  8:14 AM      Result Value Ref Range   Sodium 136 (*) 137 - 147 mEq/L   Potassium 4.1  3.7 - 5.3 mEq/L  Chloride 100  96 - 112 mEq/L   CO2 19  19 - 32 mEq/L   Glucose, Bld 199 (*) 70 - 99 mg/dL   BUN 33 (*) 6 - 23 mg/dL   Creatinine, Ser 1.12 (*) 0.50 - 1.10 mg/dL   Calcium 9.0  8.4 - 10.5 mg/dL   Total Protein 7.1  6.0 - 8.3 g/dL   Albumin 3.1 (*) 3.5 - 5.2 g/dL   AST 18  0 - 37 U/L   ALT 13  0 - 35 U/L   Alkaline Phosphatase 89  39 - 117 U/L   Total Bilirubin 0.5  0.3 - 1.2 mg/dL   GFR calc non Af Amer 48 (*) >90 mL/min   GFR calc Af Amer 55 (*) >90 mL/min   Comment: (NOTE)     The eGFR has been calculated using the CKD EPI equation.     This calculation has not been validated in all clinical situations.     eGFR's persistently <90 mL/min signify possible Chronic Kidney     Disease.   Anion gap 17 (*) 5 - 15  GLUCOSE, CAPILLARY     Status: Abnormal   Collection Time    07/17/14 11:19 AM      Result Value Ref Range   Glucose-Capillary 146 (*) 70 - 99 mg/dL  GLUCOSE, CAPILLARY     Status: Abnormal   Collection Time    07/17/14  5:15 PM      Result Value Ref Range   Glucose-Capillary 122 (*) 70 - 99 mg/dL  GLUCOSE, CAPILLARY     Status:  Abnormal   Collection Time    07/17/14  9:10 PM      Result Value Ref Range   Glucose-Capillary 168 (*) 70 - 99 mg/dL  GLUCOSE, CAPILLARY     Status: Abnormal   Collection Time    07/18/14  6:31 AM      Result Value Ref Range   Glucose-Capillary 137 (*) 70 - 99 mg/dL  GLUCOSE, CAPILLARY     Status: Abnormal   Collection Time    07/18/14  8:59 AM      Result Value Ref Range   Glucose-Capillary 136 (*) 70 - 99 mg/dL  GLUCOSE, CAPILLARY     Status: Abnormal   Collection Time    07/18/14 11:44 AM      Result Value Ref Range   Glucose-Capillary 155 (*) 70 - 99 mg/dL  GLUCOSE, CAPILLARY     Status: Abnormal   Collection Time    07/18/14  5:12 PM      Result Value Ref Range   Glucose-Capillary 145 (*) 70 - 99 mg/dL  GLUCOSE, CAPILLARY     Status: Abnormal   Collection Time    07/18/14  9:23 PM      Result Value Ref Range   Glucose-Capillary 173 (*) 70 - 99 mg/dL  BASIC METABOLIC PANEL     Status: Abnormal   Collection Time    07/19/14  5:00 AM      Result Value Ref Range   Sodium 138  137 - 147 mEq/L   Potassium 3.8  3.7 - 5.3 mEq/L   Chloride 102  96 - 112 mEq/L   CO2 21  19 - 32 mEq/L   Glucose, Bld 106 (*) 70 - 99 mg/dL   BUN 46 (*) 6 - 23 mg/dL   Creatinine, Ser 1.36 (*) 0.50 - 1.10 mg/dL   Calcium 9.0  8.4 - 10.5 mg/dL   GFR  calc non Af Amer 38 (*) >90 mL/min   GFR calc Af Amer 44 (*) >90 mL/min   Comment: (NOTE)     The eGFR has been calculated using the CKD EPI equation.     This calculation has not been validated in all clinical situations.     eGFR's persistently <90 mL/min signify possible Chronic Kidney     Disease.   Anion gap 15  5 - 15  GLUCOSE, CAPILLARY     Status: Abnormal   Collection Time    07/19/14  7:15 AM      Result Value Ref Range   Glucose-Capillary 106 (*) 70 - 99 mg/dL   Comment 1 Notify RN       HEENT: normal Cardio: RRR and no murmur Resp: CTA B/L and unlabored GI: BS positive and NT Extremity:  Pulses positive and No Edema Skin:    Intact and Other dry Neuro: Alert/Oriented, Normal Sensory, Abnormal Motor 2- R delt, bi, tri grip, 0/5 RLE except trace knee ext and Reflexes: 3+ Musc/Skel:  Other no pain with R shoulder AAROM Gen NAD   Assessment/Plan: 1. 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 Physiatrist is providing close team supervision and 24 hour management of active medical problems listed below. Physiatrist and rehab team continue to assess barriers to discharge/monitor patient progress toward functional and medical goals. FIM: FIM - Bathing Bathing Steps Patient Completed: Chest;Right Arm;Abdomen Bathing: 0: Activity did not occur  FIM - Upper Body Dressing/Undressing Upper body dressing/undressing steps patient completed: Put head through opening of pull over shirt/dress;Pull shirt over trunk Upper body dressing/undressing: 3: Mod-Patient completed 50-74% of tasks FIM - Lower Body Dressing/Undressing Lower body dressing/undressing steps patient completed: Thread/unthread left underwear leg;Thread/unthread left pants leg Lower body dressing/undressing: 1: Two helpers  FIM - Toileting Toileting: 1: Two helpers  FIM - Radio producer Devices: Product manager Transfers: 1-Two helpers  FIM - Control and instrumentation engineer Devices: Arm rests Bed/Chair Transfer: 2: Supine > Sit: Max A (lifting assist/Pt. 25-49%);2: Bed > Chair or W/C: Max A (lift and lower assist);2: Chair or W/C > Bed: Max A (lift and lower assist)  FIM - Locomotion: Wheelchair Distance: 75 Locomotion: Wheelchair: 2: Travels 50 - 149 ft with minimal assistance (Pt.>75%) FIM - Locomotion: Ambulation Locomotion: Ambulation: 0: Activity did not occur  Comprehension Comprehension Mode: Auditory Comprehension: 5-Follows basic  conversation/direction: With no assist  Expression Expression Mode: Verbal Expression: 5-Expresses basic needs/ideas: With extra time/assistive device  Social Interaction Social Interaction: 4-Interacts appropriately 75 - 89% of the time - Needs redirection for appropriate language or to initiate interaction.  Problem Solving Problem Solving: 4-Solves basic 75 - 89% of the time/requires cueing 10 - 24% of the time  Memory Memory: 3-Recognizes or recalls 50 - 74% of the time/requires cueing 25 - 49% of the time   1. Functional deficits secondary to left CVA with right-sided weakness  2. DVT Prophylaxis/Anticoagulation: SCDs. Monitor for any signs of DVT  3. Pain Management: Tylenol as needed  4. Hypertension. Amlodipine 10 mg daily, lisinopril 40 mg daily, metoprolol 50 mg daily, hydrochlorothiazide 12.5 mg daily.BP on low side, elevated BUN will hold HCTZ Monitor with increased mobility  5. Neuropsych: This patient is capable of making decisions on her own behalf.  6. Skin/Wound Care: Routine skin checks  7. Diabetes mellitus with  peripheral neuropathy. Lantus insulin 25 units daily. Check blood sugars a.c. and at bedtime  8. Hyperlipidemia. Atorvastatin 80 mg daily  9. Urinary tract infection. Cipro 500 mg twice a day 07/12/2014 until 07/19/2014. Continue oxybutynin 5 mg one half tablet twice a day for history of urinary incontinence LOS (Days) 5 A FACE TO FACE EVALUATION WAS PERFORMED  KIRSTEINS,ANDREW E 07/19/2014, 9:11 AM

## 2014-07-19 NOTE — Progress Notes (Signed)
Occupational Therapy Session Note  Patient Details  Name: Megan Moore MRN: [id number] Date of Birth: 1941/09/30  Today's Date: 07/19/2014 OT Individual Time: 5189-8421 OT Individual Time Calculation (min): 60 min    Short Term Goals: Week 1:  OT Short Term Goal 1 (Week 1): Pt will perform LB dressing with Max A in order to increase I with self care OT Short Term Goal 2 (Week 1): Pt will perform shower transfer with Mod A in order to increase I in functional transfers. OT Short Term Goal 3 (Week 1): Pt will perform toilet transfer with Mod A in order to increase I in functional transfers.  OT Short Term Goal 4 (Week 1): Pt will perform toileting with Mod A in order to increase I with self care. OT Short Term Goal 5 (Week 1): Pt perform UB dressing with Min A in order to increase I in self care.   Skilled Therapeutic Interventions/Progress Updates:    Pt seen for BADL retraining of B/D with a focus on dynamic balance in sitting, use of RUE, and activity tolerance. Pt worked on LB bathing and dressing from supine to strengthen hip with bridging and rolling. Pt did well reaching to most of her legs to bathe, bridged with mod A for R leg to pull pants up/down hips and rolled to L without assist to continue pulling pants over hips with A. Pt then sat to EOB and blood pressure checked 3x (all WNL) with no c/o dizziness.  Pt demonstrated improved balance with minimal posterior lean at times. Hand over hand A to RUE to wash L arm, grip improving but pt had difficulty fully holding onto washcloth. Pt tolerated sitting 20 - 30 min before transferring to w/c with max A to L side. Max A to facilitate forward lean. Pt completed grooming in chair and lap tray applied. Pt with Quick release belt on and all needs met.  Therapy Documentation Precautions:  Precautions Precautions: Fall Precaution Comments: pt pushes toward hemiplegic side Restrictions Weight Bearing Restrictions: No    Vital  Signs: Therapy Vitals BP: 102/58 mmHg Patient Position (if appropriate): Lying Pain: Pain Assessment Pain Assessment: No/denies pain ADL: See FIM for current functional status  Therapy/Group: Individual Therapy  [place] 07/19/2014, 12:21 PM

## 2014-07-19 NOTE — Progress Notes (Signed)
Social Work Patient ID: Megan Moore, female   DOB: 09-20-1941, 73 y.o.   MRN: [id number] Met with pt and spoke with husband via telephone to discuss team conference goals-min level and discharge 10/2.  Husband wants to be sure pt needs a ramp before looking and having one built. Will ask PT to discuss with him, but pt probably does need a ramp at discharge due to number of steps.  Husband was here to go through therapies with pt today.  Sister's in pt's room when  Came in and report they will help also.

## 2014-07-19 NOTE — Progress Notes (Signed)
Speech Language Pathology Daily Session Note  Patient Details  Name: Megan Moore MRN: [id number] Date of Birth: 1941/04/07  Today's Date: 07/19/2014 SLP Individual Time: 4098-1191 SLP Individual Time Calculation (min): 47 min  Short Term Goals: Week 1: SLP Short Term Goal 1 (Week 1): Pt will improve selective attention to complete functional tasks in a moderately distracting environment for 7-10 minutes with supervision cues to redirect.  SLP Short Term Goal 2 (Week 1): Pt wiil improve semi-complex problem solving for medication management for 80% accuracy with supervision. SLP Short Term Goal 3 (Week 1): Pt will improve use of external aids to facilitate improved recall of new, complex information for 80% accuracy with supervision   Skilled Therapeutic Interventions:  Pt was seen for skilled speech therapy targeting cognitive goals.  Upon arrival, pt was partially reclined in bed, awake, alert, and agreeable to participate in ST.   Pt recognized SLP from initial evaluation >72 hours ago.   Additionally, pt recalled activities of previous therapy session including where SLP left external aid for medication management with min assist question cues.  SLP facilitated the session with a structured medication management task targeting loading a pill box with personal scheduled medications of varying dosages and frequencies during which pt required overall min assist verbal cues for organization and error awareness for ~75% accuracy.  SLP recommended that she have assistance for medication management at discharge.  Pt recalled rationale for changing rooms to facilitate improved right attention with min question cues.   Continue per current plan of care.   FIM:  Comprehension Comprehension Mode: Auditory Comprehension: 5-Follows basic conversation/direction: With extra time/assistive device Expression Expression Mode: Verbal Expression: 5-Expresses basic needs/ideas: With extra time/assistive  device Social Interaction Social Interaction: 5-Interacts appropriately 90% of the time - Needs monitoring or encouragement for participation or interaction. Problem Solving Problem Solving: 4-Solves basic 75 - 89% of the time/requires cueing 10 - 24% of the time Memory Memory: 4-Recognizes or recalls 75 - 89% of the time/requires cueing 10 - 24% of the time  Pain Pain Assessment Pain Assessment: No/denies pain  Therapy/Group: Individual Therapy  Jackalyn Lombard, M.A. CCC-SLP  Shepard Keltz, Melanee Spry 07/19/2014, 10:40 AM

## 2014-07-19 NOTE — Progress Notes (Addendum)
Physical Therapy Session Note  Patient Details  Name: Megan Moore MRN: [id number] Date of Birth: 06-Apr-1941  Today's Date: 07/19/2014 PT Individual Time: 1115-1200; 4098-1191 PT Individual Time Calculation (min): 45 min , 30 min  Short Term Goals: Week 1:  PT Short Term Goal 1 (Week 1): Pt to perform bed mobility with mod Ax1person without use of hospital bed functions PT Short Term Goal 2 (Week 1): Pt to perform bed<>w/c transfers with mod Ax1person PT Short Term Goal 3 (Week 1): Pt to maintain dynamic sitting balance with min Ax1person PT Short Term Goal 4 (Week 1): Pt to propel w/c 75' with min Ax1person PT Short Term Goal 5 (Week 1): Pt to ambulate 10' with LRAD and Ax2persons  Skilled Therapeutic Interventions/Progress Updates:  tx 1 focused on w/c propulsion and neuro re-ed for midline orientation, bil hip strengthening.  BP 133/81; HR 56  W/c propulsion in hemi height w/c, using hemi method LUE and LLE, x 40' with one rest break.  Mod cues needed to avoid obstacles on R due to inadequate steering with L foot.  neuromuscular re-education via forced use, visual, manual and VCs for midline orientation in static standing frame, and RUE use on tray of standing for gross assist with Rx bottles and lids. Pt tolerated 20 min in standing, without c/o dizziness; wearing thigh high TEDS. Pt required max multi-modal cues for bringing = wt over feet; pt tends to push R with contralateral limbs.  Also, with harness loosened, pt performed bil terminal hip extension 10 x 1  focusing on midline orientation at the same time.   Tx 2:  Focused on transfer training, neuromuscular re-education as above for midline orientation, trunk shortening/lengthening, rotating.  Pt had consistent LOB R when attempting R wt shifting in supported sitting, which improved with max multi-modal cues during repeated trials.  Max assist level SBT to L and R, limiting use of LUE to lessen pushing.  Pt tends to give maximum  effort when attempting scooting, transfers, etc, causing LOB backwards.  With max VCs for slowing down and making small movements, she has fewer LOB and better safety.     Therapy Documentation Precautions:  Precautions Precautions: Fall Precaution Comments: pt pushes toward hemiplegic side Restrictions Weight Bearing Restrictions: No   Pain: Pain Assessment Pain Assessment: No/denies pain   See FIM for current functional status  Therapy/Group: Individual Therapy  Crislyn Willbanks 07/19/2014, 12:11 PM

## 2014-07-19 NOTE — Plan of Care (Signed)
Problem: RH BOWEL ELIMINATION Goal: RH STG MANAGE BOWEL WITH ASSISTANCE STG Manage Bowel with min Assistance.  Outcome: Not Progressing Brief Goal: RH STG MANAGE BOWEL W/MEDICATION W/ASSISTANCE STG Manage Bowel with Medication with min Assistance.  Outcome: Progressing Brief   Problem: RH BLADDER ELIMINATION Goal: RH STG MANAGE BLADDER WITH ASSISTANCE STG Manage Bladder With min Assistance  Outcome: Not Progressing Brief incontinence episodes

## 2014-07-19 NOTE — Patient Care Conference (Signed)
Inpatient RehabilitationTeam Conference and Plan of Care Update Date: 07/19/2014   Time: 10;40 AM    Patient Name: Megan Moore      Medical Record Number: [id number]  Date of Birth: 08/13/1941 Sex: Female         Room/Bed: 4W04C/4W04C-01 Payor Info: Payor: [employer] MEDICARE / Plan: [employer] MEDICARE CHOICE PPO / Product Type: *No Product type* /    Admitting Diagnosis: L CVA  Admit Date/Time:  07/14/2014  3:30 PM Admission Comments: No comment available   Primary Diagnosis:  <principal problem not specified> Principal Problem: <principal problem not specified>  Patient Active Problem List   Diagnosis Date Noted  . CVA (cerebral infarction) 07/14/2014    Expected Discharge Date: Expected Discharge Date: 08/04/14  Team Members Present: Physician leading conference: Dr. Claudette Laws Social Worker Present: Dossie Der, LCSW Nurse Present: Carlean Purl, RN PT Present: Wanda Plump, PT;Blair Hobble, PT OT Present: Rosalio Loud, OT SLP Present: Jackalyn Lombard, SLP PPS Coordinator present : Tora Duck, RN, CRRN     Current Status/Progress Goal Weekly Team Focus  Medical   constipation, pushers syndrome, Right neglect  improve attn to right  med management of constipation   Bowel/Bladder   continent bowel, incontinent bladder.  pt experiences urgency, wore pull up at home prior to admission  manage b/b with timed toileting, min assist  encourage timed toileting    Swallow/Nutrition/ Hydration     WFL        ADL's   max - total A overall  supervision UB self care, min A LB self care  ADL retraining, NDT, LUE neuro re-ed, functional mobility, pt/family education   Mobility   Max A bed mobility and transfers; S/Max A sitting balance (pushes); Min A short distance WC; pre-gait/stairs  Min A bed mobiltiy and transfers, Min A gait and 6 stairs, S WC x150'  neuro re-ed; sitting balance; WC propulsion; transfers; bed mobility   Communication     [facility]        Safety/Cognition/  Behavioral Observations  mod assist for memory, problem solving, and awareness   supervision   semi-complex problem solving and education related to compensatory strategies    Pain   pt complains of no pain at this time, tylenol prn for generalized pain  pt will keep pain at a 2 or less  assess pain q shift and medicate prn   Skin   skin clean, dry, intact  remain free of skin breakdown  assess skin q shift      *See Care Plan and progress notes for long and short-term goals.  Barriers to Discharge: 6 steps to get into home    Possible Resolutions to Barriers:  ramp vs upgrade mobility    Discharge Planning/Teaching Needs:  Home with husband and children to assist-encouraged husband to attend therapies with pt.      Team Discussion:  Urgency with voiding otherwise continent.  Pusher syndrome.  Slow progress, needing much assist currently.  SP to see 3x weekly-close to baseline cognitive function.  Revisions to Treatment Plan:  None   Continued Need for Acute Rehabilitation Level of Care: The patient requires daily medical management by a physician with specialized training in physical medicine and rehabilitation for the following conditions: Daily analysis of laboratory values and/or radiology reports with any subsequent need for medication adjustment of medical intervention for : Neurological problems;Other  Lucy Chris 07/19/2014, 3:34 PM

## 2014-07-20 ENCOUNTER — Inpatient Hospital Stay (HOSPITAL_COMMUNITY): Payer: Medicare PPO

## 2014-07-20 ENCOUNTER — Inpatient Hospital Stay (HOSPITAL_COMMUNITY): Payer: Medicare PPO | Admitting: Occupational Therapy

## 2014-07-20 LAB — GLUCOSE, CAPILLARY
Glucose-Capillary: 99 mg/dL (ref 70–99)
Glucose-Capillary: 128 mg/dL — ABNORMAL HIGH (ref 70–99)
Glucose-Capillary: 174 mg/dL — ABNORMAL HIGH (ref 70–99)
Glucose-Capillary: 165 mg/dL — ABNORMAL HIGH (ref 70–99)

## 2014-07-20 NOTE — Progress Notes (Addendum)
Physical Therapy Session Note  Patient Details  Name: Megan Moore MRN: [id number] Date of Birth: May 23, 1941  Today's Date: 07/20/2014 PT Individual Time:0805-0905; 1400-1500 PT Individual Time Calculation (min): 60 min ; 60 min  Short Term Goals: Week 1:  PT Short Term Goal 1 (Week 1): Pt to perform bed mobility with mod Ax1person without use of hospital bed functions PT Short Term Goal 2 (Week 1): Pt to perform bed<>w/c transfers with mod Ax1person PT Short Term Goal 3 (Week 1): Pt to maintain dynamic sitting balance with min Ax1person PT Short Term Goal 4 (Week 1): Pt to propel w/c 75' with min Ax1person PT Short Term Goal 5 (Week 1): Pt to ambulate 10' with LRAD and Ax2persons  Skilled Therapeutic Interventions/Progress Updates:    tx 1 focused on neuromuscular re-education via manual cues, demo, VCs for RLE isolated hip flex, quad sets, hip abd/add, hip ext, hip IR/ER all performed in supine; isolated R hip flex/ext in L sidelying  Bed mobility: practiced components of rolling, min assist with tactile and VCs for bringing RUE and RLE across body to facilitate roll to L; tactile cues fading to VCs to roll R.  Sitting balance EOB with good midline orientation, without cues or pushing with L hand.  Transfer training for squat pivot bed>< w/c to L and R.  Without use of LUE, pt's tendency to push with L foot continues to cause LOB R, requiring max assist.  W/c propulsion x 70' using hemi method with LUE and LLE with supervision.  PT set up pt for breakfast.  All needs left within reach.  tx 2 focused on neuromuscular re-education via forced use, VCs, manual cues for midline orientation, RLe stance stability, R swing phase facilitation, dynamic sitting balance  Transfers with max assist with or without SB.   Sitting balance focusing on decreasing pushing with L limbs.  Reaching out of BOS with L hand, with feet unsupported in sitting, LOB R 3/10 trials requiring min assist to  regain.  Wearing R AFO, Sit> stand with mod assist> max assist.   Gait x 10' with +2 assist for RLE control in stance.  Pt able to advance R foot. Up/down 3 (5" high) steps with 2 rails, +2 assist.  Ascended forward, descended backwards.  Therapy Documentation Precautions:  Precautions Precautions: Fall Precaution Comments: pt pushes toward hemiplegic side Restrictions Weight Bearing Restrictions: No   Pain: Pain Assessment Pain Assessment: No/denies pain   Locomotion : Ambulation Ambulation/Gait Assistance: 1: +2 Total assist     See FIM for current functional status  Therapy/Group: Individual Therapy  Augustina Braddock 07/20/2014, 3:40 PM

## 2014-07-20 NOTE — Progress Notes (Signed)
Occupational Therapy Session Note  Patient Details  Name: Megan Moore MRN: [id number] Date of Birth: [birth year]  Today's Date: 07/20/2014 OT Individual Time: 1005-1105 OT Individual Time Calculation (min): 60 min    Short Term Goals: Week 1:  OT Short Term Goal 1 (Week 1): Pt will perform LB dressing with Max A in order to increase I with self care OT Short Term Goal 2 (Week 1): Pt will perform shower transfer with Mod A in order to increase I in functional transfers. OT Short Term Goal 3 (Week 1): Pt will perform toilet transfer with Mod A in order to increase I in functional transfers.  OT Short Term Goal 4 (Week 1): Pt will perform toileting with Mod A in order to increase I with self care. OT Short Term Goal 5 (Week 1): Pt perform UB dressing with Min A in order to increase I in self care.   Skilled Therapeutic Interventions/Progress Updates:      Pt seen for BADL retraining of toileting, bathing, and dressing with a focus on dynamic sitting and standing balance and active use of RUE.  Pt requested to use bathroom right away. She was able to transfer to and from toilet using L hand support on grab bar with a stand pivot transfer with mod A. Pt demonstrated much improved trunk and LE motor control with stand and pivoting body. On toilet, sat statically with supervision and then mod A has she had to weight shift to R to cleanse self with L hand. Pt sat in chair for UB bathing and dressing with hand over hand A to use R arm to wash left but with improved grip on washcloth. Stood at sink with max A for LB cleansing and clothing management. Pt able to partially pull up pants.  Once grooming completed, pt set up in w/c and simple self Rom exercises reviewed and practiced. Pt encouraged to do these on her own. Pt provided with soft foam block to use to increase grasp.  Pt resting in wlc with lap tray and call light in reach.  Therapy Documentation Precautions:  Precautions Precautions:  Fall Precaution Comments: pt pushes toward hemiplegic side Restrictions Weight Bearing Restrictions: No    Pain: Pain Assessment Pain Assessment: No/denies pain ADL:  See FIM for current functional status  Therapy/Group: Individual Therapy  Trejan Buda 07/20/2014, 11:28 AM

## 2014-07-20 NOTE — Progress Notes (Signed)
Subjective/Complaints: 73 year old right-handed female with history of hypertension as well as diabetes mellitus with peripheral neuropathy. Presented to [facility] 07/12/2014 with acute onset of right-sided weakness. Cranial CT scan negative. MRI of the brain showed acute infarct left side of the internal capsule as well as remote lacunar infarct of the thalami bilaterally, the left caudate head and posterior right pons. Patient did not receive TPA. Echocardiogram unremarkable. Carotid Dopplers with no ICA stenosis. Neurology services consulted placed on aspirin for CVA prophylaxis. Hospital course urinary tract infection with Cipro initiated 07/12/2014 with end date 07/19/2014  Slept ok, working with PT poor sensation on R  Review of Systems - Negative except onset of weakness about 1 mo prior to admit  Objective: Vital Signs: Blood pressure 122/64, pulse 62, temperature 97.7 F (36.5 C), temperature source Oral, resp. rate 18, weight 60.102 kg (132 lb 8 oz), SpO2 98.00%. No results found. Results for orders placed during the hospital encounter of 07/14/14 (from the past 72 hour(s))  CBC WITH DIFFERENTIAL     Status: Abnormal   Collection Time    07/17/14  8:14 AM      Result Value Ref Range   WBC 6.3  4.0 - 10.5 K/uL   RBC 4.25  3.87 - 5.11 MIL/uL   Hemoglobin 12.6  12.0 - 15.0 g/dL   HCT 37.7  36.0 - 46.0 %   MCV 88.7  78.0 - 100.0 fL   MCH 29.6  26.0 - 34.0 pg   MCHC 33.4  30.0 - 36.0 g/dL   RDW 12.5  11.5 - 15.5 %   Platelets 255  150 - 400 K/uL   Neutrophils Relative % 61  43 - 77 %   Neutro Abs 3.9  1.7 - 7.7 K/uL   Lymphocytes Relative 20  12 - 46 %   Lymphs Abs 1.3  0.7 - 4.0 K/uL   Monocytes Relative 10  3 - 12 %   Monocytes Absolute 0.6  0.1 - 1.0 K/uL   Eosinophils Relative 8 (*) 0 - 5 %   Eosinophils Absolute 0.5  0.0 - 0.7 K/uL   Basophils Relative 1  0 - 1 %   Basophils Absolute 0.1  0.0 - 0.1 K/uL  COMPREHENSIVE METABOLIC PANEL     Status:  Abnormal   Collection Time    07/17/14  8:14 AM      Result Value Ref Range   Sodium 136 (*) 137 - 147 mEq/L   Potassium 4.1  3.7 - 5.3 mEq/L   Chloride 100  96 - 112 mEq/L   CO2 19  19 - 32 mEq/L   Glucose, Bld 199 (*) 70 - 99 mg/dL   BUN 33 (*) 6 - 23 mg/dL   Creatinine, Ser 1.12 (*) 0.50 - 1.10 mg/dL   Calcium 9.0  8.4 - 10.5 mg/dL   Total Protein 7.1  6.0 - 8.3 g/dL   Albumin 3.1 (*) 3.5 - 5.2 g/dL   AST 18  0 - 37 U/L   ALT 13  0 - 35 U/L   Alkaline Phosphatase 89  39 - 117 U/L   Total Bilirubin 0.5  0.3 - 1.2 mg/dL   GFR calc non Af Amer 48 (*) >90 mL/min   GFR calc Af Amer 55 (*) >90 mL/min   Comment: (NOTE)     The eGFR has been calculated using the CKD EPI equation.     This calculation has not been validated in all clinical situations.  eGFR's persistently <90 mL/min signify possible Chronic Kidney     Disease.   Anion gap 17 (*) 5 - 15  GLUCOSE, CAPILLARY     Status: Abnormal   Collection Time    07/17/14 11:19 AM      Result Value Ref Range   Glucose-Capillary 146 (*) 70 - 99 mg/dL  GLUCOSE, CAPILLARY     Status: Abnormal   Collection Time    07/17/14  5:15 PM      Result Value Ref Range   Glucose-Capillary 122 (*) 70 - 99 mg/dL  GLUCOSE, CAPILLARY     Status: Abnormal   Collection Time    07/17/14  9:10 PM      Result Value Ref Range   Glucose-Capillary 168 (*) 70 - 99 mg/dL  GLUCOSE, CAPILLARY     Status: Abnormal   Collection Time    07/18/14  6:31 AM      Result Value Ref Range   Glucose-Capillary 137 (*) 70 - 99 mg/dL  GLUCOSE, CAPILLARY     Status: Abnormal   Collection Time    07/18/14  8:59 AM      Result Value Ref Range   Glucose-Capillary 136 (*) 70 - 99 mg/dL  GLUCOSE, CAPILLARY     Status: Abnormal   Collection Time    07/18/14 11:44 AM      Result Value Ref Range   Glucose-Capillary 155 (*) 70 - 99 mg/dL  GLUCOSE, CAPILLARY     Status: Abnormal   Collection Time    07/18/14  5:12 PM      Result Value Ref Range   Glucose-Capillary  145 (*) 70 - 99 mg/dL  GLUCOSE, CAPILLARY     Status: Abnormal   Collection Time    07/18/14  9:23 PM      Result Value Ref Range   Glucose-Capillary 173 (*) 70 - 99 mg/dL  BASIC METABOLIC PANEL     Status: Abnormal   Collection Time    07/19/14  5:00 AM      Result Value Ref Range   Sodium 138  137 - 147 mEq/L   Potassium 3.8  3.7 - 5.3 mEq/L   Chloride 102  96 - 112 mEq/L   CO2 21  19 - 32 mEq/L   Glucose, Bld 106 (*) 70 - 99 mg/dL   BUN 46 (*) 6 - 23 mg/dL   Creatinine, Ser 1.36 (*) 0.50 - 1.10 mg/dL   Calcium 9.0  8.4 - 10.5 mg/dL   GFR calc non Af Amer 38 (*) >90 mL/min   GFR calc Af Amer 44 (*) >90 mL/min   Comment: (NOTE)     The eGFR has been calculated using the CKD EPI equation.     This calculation has not been validated in all clinical situations.     eGFR's persistently <90 mL/min signify possible Chronic Kidney     Disease.   Anion gap 15  5 - 15  GLUCOSE, CAPILLARY     Status: Abnormal   Collection Time    07/19/14  7:15 AM      Result Value Ref Range   Glucose-Capillary 106 (*) 70 - 99 mg/dL   Comment 1 Notify RN    GLUCOSE, CAPILLARY     Status: Abnormal   Collection Time    07/19/14 12:10 PM      Result Value Ref Range   Glucose-Capillary 141 (*) 70 - 99 mg/dL   Comment 1 Notify RN  GLUCOSE, CAPILLARY     Status: Abnormal   Collection Time    07/19/14  4:49 PM      Result Value Ref Range   Glucose-Capillary 187 (*) 70 - 99 mg/dL  GLUCOSE, CAPILLARY     Status: Abnormal   Collection Time    07/19/14  9:08 PM      Result Value Ref Range   Glucose-Capillary 152 (*) 70 - 99 mg/dL  GLUCOSE, CAPILLARY     Status: None   Collection Time    07/20/14  6:57 AM      Result Value Ref Range   Glucose-Capillary 99  70 - 99 mg/dL     HEENT: normal Cardio: RRR and no murmur Resp: CTA B/L and unlabored GI: BS positive and NT Extremity:  Pulses positive and No Edema Skin:   Intact and Other dry Neuro: Alert/Oriented, Normal Sensory, Abnormal Motor 2- R  delt, bi, tri grip, 1/5 RLE except trace knee ext and hip ext Reflexes: 3+ Musc/Skel:  Other no pain with R shoulder AAROM Gen NAD   Assessment/Plan: 1. 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 Physiatrist is providing close team supervision and 24 hour management of active medical problems listed below. Physiatrist and rehab team continue to assess barriers to discharge/monitor patient progress toward functional and medical goals. FIM: FIM - Bathing Bathing Steps Patient Completed: Chest;Right Arm;Abdomen;Front perineal area;Right upper leg;Left upper leg Bathing: 3: Mod-Patient completes 5-7 [id number] parts or 50-74%  FIM - Upper Body Dressing/Undressing Upper body dressing/undressing steps patient completed: Thread/unthread left sleeve of pullover shirt/dress;Put head through opening of pull over shirt/dress;Pull shirt over trunk Upper body dressing/undressing: 4: Min-Patient completed 75 plus % of tasks FIM - Lower Body Dressing/Undressing Lower body dressing/undressing steps patient completed: Thread/unthread left pants leg;Thread/unthread left underwear leg Lower body dressing/undressing: 2: Max-Patient completed 25-49% of tasks (from supine in bed)  FIM - Toileting Toileting: 1: Two helpers  FIM - TRadio producerDevices: GProduct managerTransfers: 1-Two helpers  FIM - BControl and instrumentation engineerDevices: Arm rests Bed/Chair Transfer: 2: Chair or W/C > Bed: Max A (lift and lower assist);2: Bed > Chair or W/C: Max A (lift and lower assist)  FIM - Locomotion: Wheelchair Distance: 75 Locomotion: Wheelchair: 2: Travels 581- 149 ft with minimal assistance (Pt.>75%) FIM - Locomotion: Ambulation Locomotion: Ambulation: 0: Activity did not occur  Comprehension Comprehension Mode:  Auditory Comprehension: 5-Follows basic conversation/direction: With no assist  Expression Expression Mode: Verbal Expression: 5-Expresses basic needs/ideas: With extra time/assistive device  Social Interaction Social Interaction: 4-Interacts appropriately 75 - 89% of the time - Needs redirection for appropriate language or to initiate interaction.  Problem Solving Problem Solving: 4-Solves basic 75 - 89% of the time/requires cueing 10 - 24% of the time  Memory Memory: 3-Recognizes or recalls 50 - 74% of the time/requires cueing 25 - 49% of the time   1. Functional deficits secondary to left CVA with right-sided weakness  2. DVT Prophylaxis/Anticoagulation: SCDs. Monitor for any signs of DVT  3. Pain Management: Tylenol as needed  4. Hypertension. Amlodipine 10 mg daily, lisinopril 40 mg daily, metoprolol 50 mg daily, hydrochlorothiazide 12.5 mg daily.BP on low side, elevated BUN will hold HCTZ Monitor with increased mobility  5. Neuropsych: This patient is capable of making decisions on her own behalf.  6. Skin/Wound  Care: Routine skin checks  7. Diabetes mellitus with peripheral neuropathy. Lantus insulin 25 units daily. Check blood sugars a.c. and at bedtime  8. Hyperlipidemia. Atorvastatin 80 mg daily  9. Urinary tract infection. Cipro 500 mg twice a day 07/12/2014 until 07/19/2014. Continue oxybutynin 5 mg one half tablet twice a day for history of urinary incontinence LOS (Days) 6 A FACE TO FACE EVALUATION WAS PERFORMED  Megan Moore E 07/20/2014, 8:09 AM

## 2014-07-21 ENCOUNTER — Inpatient Hospital Stay (HOSPITAL_COMMUNITY): Payer: Medicare PPO | Admitting: *Deleted

## 2014-07-21 ENCOUNTER — Inpatient Hospital Stay (HOSPITAL_COMMUNITY): Payer: Medicare PPO | Admitting: Occupational Therapy

## 2014-07-21 ENCOUNTER — Inpatient Hospital Stay (HOSPITAL_COMMUNITY): Payer: Medicare PPO | Admitting: Speech Pathology

## 2014-07-21 LAB — GLUCOSE, CAPILLARY
GLUCOSE-CAPILLARY: 88 mg/dL (ref 70–99)
Glucose-Capillary: 203 mg/dL — ABNORMAL HIGH (ref 70–99)
Glucose-Capillary: 98 mg/dL (ref 70–99)
Glucose-Capillary: 147 mg/dL — ABNORMAL HIGH (ref 70–99)

## 2014-07-21 NOTE — Progress Notes (Signed)
Subjective/Complaints: 73 year old right-handed female with history of hypertension as well as diabetes mellitus with peripheral neuropathy. Presented to [facility] 07/12/2014 with acute onset of right-sided weakness. Cranial CT scan negative. MRI of the brain showed acute infarct left side of the internal capsule as well as remote lacunar infarct of the thalami bilaterally, the left caudate head and posterior right pons. Patient did not receive TPA. Echocardiogram unremarkable. Carotid Dopplers with no ICA stenosis. Neurology services consulted placed on aspirin for CVA prophylaxis. Hospital course urinary tract infection with Cipro initiated 07/12/2014 with end date 07/19/2014  Patient denies any pain complaints today. No breathing issues.  Review of Systems - Negative except R side weak  Objective: Vital Signs: Blood pressure 122/55, pulse 62, temperature 98 F (36.7 C), temperature source Oral, resp. rate 18, weight 60.102 kg (132 lb 8 oz), SpO2 100.00%. No results found. Results for orders placed during the hospital encounter of 07/14/14 (from the past 72 hour(s))  GLUCOSE, CAPILLARY     Status: Abnormal   Collection Time    07/18/14 11:44 AM      Result Value Ref Range   Glucose-Capillary 155 (*) 70 - 99 mg/dL  GLUCOSE, CAPILLARY     Status: Abnormal   Collection Time    07/18/14  5:12 PM      Result Value Ref Range   Glucose-Capillary 145 (*) 70 - 99 mg/dL  GLUCOSE, CAPILLARY     Status: Abnormal   Collection Time    07/18/14  9:23 PM      Result Value Ref Range   Glucose-Capillary 173 (*) 70 - 99 mg/dL  BASIC METABOLIC PANEL     Status: Abnormal   Collection Time    07/19/14  5:00 AM      Result Value Ref Range   Sodium 138  137 - 147 mEq/L   Potassium 3.8  3.7 - 5.3 mEq/L   Chloride 102  96 - 112 mEq/L   CO2 21  19 - 32 mEq/L   Glucose, Bld 106 (*) 70 - 99 mg/dL   BUN 46 (*) 6 - 23 mg/dL   Creatinine, Ser 1.36 (*) 0.50 - 1.10 mg/dL   Calcium 9.0   8.4 - 10.5 mg/dL   GFR calc non Af Amer 38 (*) >90 mL/min   GFR calc Af Amer 44 (*) >90 mL/min   Comment: (NOTE)     The eGFR has been calculated using the CKD EPI equation.     This calculation has not been validated in all clinical situations.     eGFR's persistently <90 mL/min signify possible Chronic Kidney     Disease.   Anion gap 15  5 - 15  GLUCOSE, CAPILLARY     Status: Abnormal   Collection Time    07/19/14  7:15 AM      Result Value Ref Range   Glucose-Capillary 106 (*) 70 - 99 mg/dL   Comment 1 Notify RN    GLUCOSE, CAPILLARY     Status: Abnormal   Collection Time    07/19/14 12:10 PM      Result Value Ref Range   Glucose-Capillary 141 (*) 70 - 99 mg/dL   Comment 1 Notify RN    GLUCOSE, CAPILLARY     Status: Abnormal   Collection Time    07/19/14  4:49 PM      Result Value Ref Range   Glucose-Capillary 187 (*) 70 - 99 mg/dL  GLUCOSE, CAPILLARY     Status:  Abnormal   Collection Time    07/19/14  9:08 PM      Result Value Ref Range   Glucose-Capillary 152 (*) 70 - 99 mg/dL  GLUCOSE, CAPILLARY     Status: None   Collection Time    07/20/14  6:57 AM      Result Value Ref Range   Glucose-Capillary 99  70 - 99 mg/dL  GLUCOSE, CAPILLARY     Status: Abnormal   Collection Time    07/20/14 11:34 AM      Result Value Ref Range   Glucose-Capillary 128 (*) 70 - 99 mg/dL   Comment 1 Notify RN    GLUCOSE, CAPILLARY     Status: Abnormal   Collection Time    07/20/14  4:51 PM      Result Value Ref Range   Glucose-Capillary 174 (*) 70 - 99 mg/dL  GLUCOSE, CAPILLARY     Status: Abnormal   Collection Time    07/20/14  8:43 PM      Result Value Ref Range   Glucose-Capillary 165 (*) 70 - 99 mg/dL   Comment 1 Notify RN    GLUCOSE, CAPILLARY     Status: None   Collection Time    07/21/14  6:58 AM      Result Value Ref Range   Glucose-Capillary 88  70 - 99 mg/dL   Comment 1 Notify RN       HEENT: normal Cardio: RRR and no murmur Resp: CTA B/L and unlabored GI: BS  positive and NT Extremity:  Pulses positive and No Edema Skin:   Intact and Other dry Neuro: Alert/Oriented, Normal Sensory, Abnormal Motor 2- R delt, bi, tri grip, 1/5 RLE except trace knee ext and hip ext Reflexes: 3+ Musc/Skel:  Other no pain with R shoulder AAROM Gen NAD   Assessment/Plan: 1. 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 Physiatrist is providing close team supervision and 24 hour management of active medical problems listed below. Physiatrist and rehab team continue to assess barriers to discharge/monitor patient progress toward functional and medical goals. FIM: FIM - Bathing Bathing Steps Patient Completed: Chest;Right Arm;Abdomen;Front perineal area;Right upper leg;Left upper leg;Buttocks;Left lower leg (including foot) Bathing: 4: Min-Patient completes 8-9 [id number] parts or 75+ percent  FIM - Upper Body Dressing/Undressing Upper body dressing/undressing steps patient completed: Thread/unthread left sleeve of pullover shirt/dress;Put head through opening of pull over shirt/dress;Thread/unthread right sleeve of pullover shirt/dresss Upper body dressing/undressing: 4: Min-Patient completed 75 plus % of tasks FIM - Lower Body Dressing/Undressing Lower body dressing/undressing steps patient completed: Thread/unthread left underwear leg;Thread/unthread left pants leg Lower body dressing/undressing: 2: Max-Patient completed 25-49% of tasks  FIM - Toileting Toileting steps completed by patient: Performs perineal hygiene Toileting: 2: Max-Patient completed 1 of 3 steps  FIM - TRadio producerDevices: Grab bars Toilet Transfers: 3-From toilet/BSC: Mod A (lift or lower assist);3-To toilet/BSC: Mod A (lift or lower assist)  FIM - BEngineer, siteAssistive Devices: Arm  rests Bed/Chair Transfer: 2: Chair or W/C > Bed: Max A (lift and lower assist);2: Bed > Chair or W/C: Max A (lift and lower assist)  FIM - Locomotion: Wheelchair Distance: 75 Locomotion: Wheelchair: 2: Travels 50 - 149 ft with supervision, cueing or coaxing FIM - Locomotion: Ambulation Locomotion: Ambulation Assistive Devices: Other (comment);Orthosis (none) Ambulation/Gait Assistance: 1: +2 Total assist  Locomotion: Ambulation: 1: Two helpers  Comprehension Comprehension Mode: Auditory Comprehension: 5-Understands complex 90% of the time/Cues < 10% of the time  Expression Expression Mode: Verbal Expression: 5-Expresses basic needs/ideas: With extra time/assistive device  Social Interaction Social Interaction: 4-Interacts appropriately 75 - 89% of the time - Needs redirection for appropriate language or to initiate interaction.  Problem Solving Problem Solving: 4-Solves basic 75 - 89% of the time/requires cueing 10 - 24% of the time  Memory Memory: 3-Recognizes or recalls 50 - 74% of the time/requires cueing 25 - 49% of the time   1. Functional deficits secondary to left CVA with right-sided weakness  2. DVT Prophylaxis/Anticoagulation: SCDs. Monitor for any signs of DVT  3. Pain Management: Tylenol as needed  4. Hypertension. Amlodipine 10 mg daily, lisinopril 40 mg daily, metoprolol 50 mg daily, hydrochlorothiazide 12.5 mg daily.BP on low side, elevated BUN will hold HCTZ Monitor with increased mobility  5. Neuropsych: This patient is capable of making decisions on her own behalf.  6. Skin/Wound Care: Routine skin checks  7. Diabetes mellitus with peripheral neuropathy. Lantus insulin 25 units daily. Check blood sugars a.c. and at bedtime  8. Hyperlipidemia. Atorvastatin 80 mg daily  9. Urinary tract infection. Cipro 500 mg twice a day completed, monitor for recurrence of symptoms. Continue oxybutynin 5 mg one half tablet twice a day for history of urinary incontinence LOS  (Days) 7 A FACE TO FACE EVALUATION WAS PERFORMED  Megan Moore E 07/21/2014, 10:24 AM

## 2014-07-21 NOTE — Progress Notes (Signed)
Physical Therapy Weekly Progress Note  Patient Details  Name: Megan Moore MRN: [id number] Date of Birth: July 18, 1941  Beginning of progress report period: July 14, 2014 End of progress report period: July 21, 2014  Today's Date: 07/21/2014 PT Individual Time: 0905-1005 PT Individual Time Calculation (min): 60 min   Patient has met 3 of 5 short term goals.  Pt is making steady progress in R-sided activation of UE/LE, static sitting balance, R-sided attention, and activity tolerance. Pt has begun walking with +2 assist this week, and requires only S with WC propulsion in controlled settings. Pt continues to require Max A for transfers, but pushing tendancies had reduced.   Patient continues to demonstrate the following deficits: decreased sensation, decreased motor control and coordination, pusher syndrome, and therefore will continue to benefit from skilled PT intervention to enhance overall performance with activity tolerance, balance, functional use of  right upper extremity and right lower extremity, attention, awareness and coordination.  Patient progressing toward long term goals..  Continue plan of care.  PT Short Term Goals Week 1:  PT Short Term Goal 1 (Week 1): Pt to perform bed mobility with mod Ax1person without use of hospital bed functions PT Short Term Goal 1 - Progress (Week 1): Progressing toward goal PT Short Term Goal 2 (Week 1): Pt to perform bed<>w/c transfers with mod Ax1person PT Short Term Goal 2 - Progress (Week 1): Progressing toward goal PT Short Term Goal 3 (Week 1): Pt to maintain dynamic sitting balance with min Ax1person PT Short Term Goal 3 - Progress (Week 1): Met PT Short Term Goal 4 (Week 1): Pt to propel w/c 75' with min Ax1person PT Short Term Goal 4 - Progress (Week 1): Met PT Short Term Goal 5 (Week 1): Pt to ambulate 10' with LRAD and Ax2persons PT Short Term Goal 5 - Progress (Week 1): Met Week 2:  PT Short Term Goal 1 (Week 2): Pt will  perform bed<>chair transfers wtih mod A consistently PT Short Term Goal 2 (Week 2): Pt will perform bed mobility with Min A PT Short Term Goal 3 (Week 2): Pt will ambulate x25' with Ax1 person and LRAD PT Short Term Goal 4 (Week 2): Pt will propell WC x150' with S  Skilled Therapeutic Interventions/Progress Updates: Tx focused on transfer training; functional mobility; gait with Clarise Cruz Plus; and NMR via forced use, manual facilitation, and multimodal cues.  Pt up in bed, agreeable to PT, but needing to change briefs. Pt encouraged to call for assist when she notices wetness rather than waiting.   Therapeutic activity Performed rolling R/L x4 with Min A for set-up and sequence cues. Pt able to cleanse self, but needing Mod A for donning pull-up brief. Pt able to bridge with set-up A. Max A needed as well as sequence/technique cues for supine>sit for trunk and LE management.  Pt instructed in squat-pivot transfer, with focus on se-up and grading movements, rather than ballistic transitions. Pt needed Max A 4/5 transfers throughout tx, but Mod A x1 going to L. Pt is improving ability to transfer L, rather than pushing! Pt has great difficulty with anterior translation, tends to extend LEs and push back, needing max cues and manual facilitation.   Gait training Performed gait with Clarise Cruz Plus, AFO, and shoes with +2 for steering, weight shifting, and RLE management. Pt would benefit from shoe cover next trial. 2 trials x10' each with cues and manual facilitation for L shift and R knee/hip ext in stance. Consider hand rail  in wall or Harmon Pier walker if +2 available next time.   NMR via forced use, manual facilitation, and multi-modal cues provided throughout tx during all mobility to increase sitting balance, lateral scooting, and R-sided attention. Pt consistently cued to use R-sided strength available rather than L side to compensate.   Pt left up in [facility] with lap belt and all needs in reach.        Therapy  Documentation Precautions:  Precautions Precautions: Fall Precaution Comments: pt pushes toward hemiplegic side Restrictions Weight Bearing Restrictions: No    Pain: none     Locomotion : Ambulation Ambulation/Gait Assistance: 1: +2 Total assist Wheelchair Mobility Distance: 75   See FIM for current functional status  Therapy/Group: Individual Therapy Kennieth Rad, PT, DPT  07/21/2014, 10:58 AM

## 2014-07-21 NOTE — Progress Notes (Signed)
Occupational Therapy Session Note  Patient Details  Name: Megan Moore MRN: [id number] Date of Birth: 1941/04/20  Today's Date: 07/21/2014 OT Individual Time: 8295-6213 and 228-358 OT Individual Time Calculation (min): 60 min and 30 min  Short Term Goals: Week 1:  OT Short Term Goal 1 (Week 1): Pt will perform LB dressing with Max A in order to increase I with self care OT Short Term Goal 2 (Week 1): Pt will perform shower transfer with Mod A in order to increase I in functional transfers. OT Short Term Goal 3 (Week 1): Pt will perform toilet transfer with Mod A in order to increase I in functional transfers.  OT Short Term Goal 4 (Week 1): Pt will perform toileting with Mod A in order to increase I with self care. OT Short Term Goal 5 (Week 1): Pt perform UB dressing with Min A in order to increase I in self care.   Skilled Therapeutic Interventions/Progress Updates:  1)  Patient resting in w/c upon arrival stating that she was really tired.  Negotiated with patient that I would assist her into bed after out self care session and patient agreed.  Engaged in sponge bath, dress, groom, w/c>bed transfers and bed mobility.  Focused session on attention to right side, midline orientation and postural control in sitting and standing, sit><stands while engaging her RUE & RLE, forced use of RUE & RLE.  Patient initiates use of RUE during bathing and dressing yet requires hand over hand to be successful.  Patient often attempting to sneak her left foot to the left to improve her pushing ability making her standing and transitional movements very unsafe. Patient was +2 for safety for squat pivot w/c>bed traveling to her right.   2)  Patient resting in bed upon arrival with 5 family members present.  Patient agreed to participate in therapy and family observed the session.  Engaged in bed mobility, LB dressing, scoots while traveling to her left, sitting balance, sit><stands, standing balance, postural  control and midline orientation, bed>w/c transfer traveling to the left with mod assist.  Reviewed 'pusher syndrome' with family and especially husband.  After session, family denied any questions.  Therapy Documentation Precautions:  Precautions Precautions: Fall Precaution Comments: pt pushes toward hemiplegic side Restrictions Weight Bearing Restrictions: No Pain: 1)  No report of pain 2)  Right hip and behind knee pain (spasms) during transitional movements, not rated, rest and repositioned. ADL: See FIM for current functional status  Therapy/Group: Individual Therapy both sessions  Tanaia Hawkey 07/21/2014, 2:59 PM

## 2014-07-21 NOTE — Progress Notes (Signed)
Speech Language Pathology Weekly Progress and Session Note  Patient Details  Name: Megan Moore MRN: [id number] Date of Birth: 1941-09-28  Beginning of progress report period: July 15, 2014 End of progress report period: July 21, 2014  Today's Date: 07/21/2014 SLP Individual Time: 0802-0832 SLP Individual Time Calculation (min): 30 min  Short Term Goals: Week 1: SLP Short Term Goal 1 (Week 1): Pt will improve selective attention to complete functional tasks in a moderately distracting environment for 7-10 minutes with supervision cues to redirect.  SLP Short Term Goal 1 - Progress (Week 1): Met SLP Short Term Goal 2 (Week 1): Pt wiil improve semi-complex problem solving for medication management for 80% accuracy with supervision. SLP Short Term Goal 2 - Progress (Week 1): Progressing toward goal SLP Short Term Goal 3 (Week 1): Pt will improve use of external aids to facilitate improved recall of new, complex information for 80% accuracy with supervision  SLP Short Term Goal 3 - Progress (Week 1): Progressing toward goal    New Short Term Goals: Week 2: SLP Short Term Goal 1 (Week 2): Pt will improve selective attention to complete functional tasks in a moderately distracting environment for 20-30 minutes with supervision cues to redirect.  SLP Short Term Goal 2 (Week 2): Pt wiil improve semi-complex problem solving for medication management for 80% accuracy with supervision. SLP Short Term Goal 3 (Week 2): Pt will improve use of external aids to facilitate improved recall of new, complex information for 80% accuracy with supervision   Weekly Progress Updates:  Pt with functional gains and has met 1 out of 3 short term goals due to improved selective attention in distracting environments.  Pt currently requires overall min assist for cognitive tasks due to problem solving impairments characterized by decreased planning, thought organization, and error awareness.  Pt and family  education is ongoing.  Pt would continue to benefit from skilled ST while inpatient in order to maximize functional independence and reduce burden of care upon discharge.     Intensity: Minumum of 1-2 x/day, 30 to 90 minutes Frequency: 5 out of 7 days Duration/Length of Stay: 7-10 days for speech only  Treatment/Interventions: Cognitive remediation/compensation;Patient/family education;Therapeutic Activities;Internal/external aids;Environmental controls;Cueing hierarchy   Daily Session  Skilled Therapeutic Interventions: Pt was seen for skilled speech therapy targeting cognitive goals.  Upon arrival, pt was seated upright in bed, awake, alert, and agreeable to participate in ST.  SLP facilitated the session via continued medication mangement tasks targeting error awareness.  Pt required max assist to identify and correct errors made when loading a pill box for previous medication management task.  SLP initially left TV on and door open to room while pt was completing task to target selective attention; however, TV turned off to minimize distractions with increased task complexity.  Pt demonstrated basic awareness of task difficulty and became tearful after completing activity.  SLP provided support and encouragement to pt by providing examples of progress made while inpatient.  Goals updated to reflect current progress and plan of care.         FIM:  Comprehension Comprehension Mode: Auditory Comprehension: 5-Follows basic conversation/direction: With extra time/assistive device Expression Expression Mode: Verbal Expression: 5-Expresses basic needs/ideas: With extra time/assistive device Social Interaction Social Interaction: 4-Interacts appropriately 75 - 89% of the time - Needs redirection for appropriate language or to initiate interaction. Problem Solving Problem Solving: 3-Solves basic 50 - 74% of the time/requires cueing 25 - 49% of the time Memory Memory: 3-Recognizes or  recalls 50 -  74% of the time/requires cueing 25 - 49% of the time  Pain Pain Assessment Pain Assessment: No/denies pain  Therapy/Group: Individual Therapy  Izzac Rockett, Selinda Orion 07/21/2014, 11:53 AM

## 2014-07-22 ENCOUNTER — Inpatient Hospital Stay (HOSPITAL_COMMUNITY): Payer: Medicare PPO

## 2014-07-22 LAB — GLUCOSE, CAPILLARY
GLUCOSE-CAPILLARY: 87 mg/dL (ref 70–99)
GLUCOSE-CAPILLARY: 82 mg/dL (ref 70–99)
GLUCOSE-CAPILLARY: 241 mg/dL — AB (ref 70–99)
GLUCOSE-CAPILLARY: 128 mg/dL — AB (ref 70–99)

## 2014-07-22 NOTE — Progress Notes (Signed)
Subjective/Complaints: 73 year old right-handed female with history of hypertension as well as diabetes mellitus with peripheral neuropathy. Presented to [facility] 07/12/2014 with acute onset of right-sided weakness. Cranial CT scan negative. MRI of the brain showed acute infarct left side of the internal capsule as well as remote lacunar infarct of the thalami bilaterally, the left caudate head and posterior right pons. Patient did not receive TPA. Echocardiogram unremarkable. Carotid Dopplers with no ICA stenosis. Neurology services consulted placed on aspirin for CVA prophylaxis. Hospital course urinary tract infection with Cipro initiated 07/12/2014 with end date 07/19/2014  No new issues, some R groin pain  Review of Systems - Negative except R side weak  Objective: Vital Signs: Blood pressure 135/59, pulse 80, temperature 98.3 F (36.8 C), temperature source Oral, resp. rate 19, weight 60.102 kg (132 lb 8 oz), SpO2 100.00%. No results found. Results for orders placed during the hospital encounter of 07/14/14 (from the past 72 hour(s))  GLUCOSE, CAPILLARY     Status: Abnormal   Collection Time    07/19/14 12:10 PM      Result Value Ref Range   Glucose-Capillary 141 (*) 70 - 99 mg/dL   Comment 1 Notify RN    GLUCOSE, CAPILLARY     Status: Abnormal   Collection Time    07/19/14  4:49 PM      Result Value Ref Range   Glucose-Capillary 187 (*) 70 - 99 mg/dL  GLUCOSE, CAPILLARY     Status: Abnormal   Collection Time    07/19/14  9:08 PM      Result Value Ref Range   Glucose-Capillary 152 (*) 70 - 99 mg/dL  GLUCOSE, CAPILLARY     Status: None   Collection Time    07/20/14  6:57 AM      Result Value Ref Range   Glucose-Capillary 99  70 - 99 mg/dL  GLUCOSE, CAPILLARY     Status: Abnormal   Collection Time    07/20/14 11:34 AM      Result Value Ref Range   Glucose-Capillary 128 (*) 70 - 99 mg/dL   Comment 1 Notify RN    GLUCOSE, CAPILLARY     Status:  Abnormal   Collection Time    07/20/14  4:51 PM      Result Value Ref Range   Glucose-Capillary 174 (*) 70 - 99 mg/dL  GLUCOSE, CAPILLARY     Status: Abnormal   Collection Time    07/20/14  8:43 PM      Result Value Ref Range   Glucose-Capillary 165 (*) 70 - 99 mg/dL   Comment 1 Notify RN    GLUCOSE, CAPILLARY     Status: None   Collection Time    07/21/14  6:58 AM      Result Value Ref Range   Glucose-Capillary 88  70 - 99 mg/dL   Comment 1 Notify RN    GLUCOSE, CAPILLARY     Status: Abnormal   Collection Time    07/21/14 12:01 PM      Result Value Ref Range   Glucose-Capillary 203 (*) 70 - 99 mg/dL   Comment 1 Notify RN    GLUCOSE, CAPILLARY     Status: None   Collection Time    07/21/14  4:18 PM      Result Value Ref Range   Glucose-Capillary 98  70 - 99 mg/dL  GLUCOSE, CAPILLARY     Status: Abnormal   Collection Time    07/21/14  8:46 PM      Result Value Ref Range   Glucose-Capillary 147 (*) 70 - 99 mg/dL   Comment 1 Notify RN    GLUCOSE, CAPILLARY     Status: None   Collection Time    07/22/14  7:19 AM      Result Value Ref Range   Glucose-Capillary 87  70 - 99 mg/dL     HEENT: normal Cardio: RRR and no murmur Resp: CTA B/L and unlabored GI: BS positive and NT Extremity:  Pulses positive and No Edema Skin:   Intact and Other dry Neuro: Alert/Oriented, Normal Sensory, Abnormal Motor 2- R delt, bi, tri grip, 1/5 RLE except trace knee ext and hip ext Reflexes: 3+ Musc/Skel:mild groin pain with R hip ROM Gen NAD   Assessment/Plan: 1. 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 Physiatrist is providing close team supervision and 24 hour management of active medical problems listed below. Physiatrist and rehab team continue to assess barriers to discharge/monitor patient progress toward functional and medical  goals. FIM: FIM - Bathing Bathing Steps Patient Completed: Chest;Right Arm;Abdomen;Front perineal area;Right upper leg;Left upper leg;Buttocks;Left lower leg (including foot) Bathing: 4: Min-Patient completes 8-9 65f 10 parts or 75+ percent  FIM - Upper Body Dressing/Undressing Upper body dressing/undressing steps patient completed: Thread/unthread left sleeve of pullover shirt/dress;Put head through opening of pull over shirt/dress;Thread/unthread right sleeve of pullover shirt/dresss Upper body dressing/undressing: 4: Min-Patient completed 75 plus % of tasks FIM - Lower Body Dressing/Undressing Lower body dressing/undressing steps patient completed: Thread/unthread left underwear leg;Thread/unthread left pants leg Lower body dressing/undressing: 2: Max-Patient completed 25-49% of tasks  FIM - Toileting Toileting steps completed by patient: Performs perineal hygiene Toileting: 2: Max-Patient completed 1 of 3 steps  FIM - Diplomatic Services operational officer Devices: Grab bars Toilet Transfers: 3-From toilet/BSC: Mod A (lift or lower assist);3-To toilet/BSC: Mod A (lift or lower assist)  FIM - Bed/Chair Transfer Bed/Chair Transfer Assistive Devices: Arm rests Bed/Chair Transfer: 2: Supine > Sit: Max A (lifting assist/Pt. 25-49%);2: Bed > Chair or W/C: Max A (lift and lower assist);2: Chair or W/C > Bed: Max A (lift and lower assist)  FIM - Locomotion: Wheelchair Distance: 75 Locomotion: Wheelchair: 2: Travels 50 - 149 ft with supervision, cueing or coaxing FIM - Locomotion: Ambulation Locomotion: Ambulation Assistive Devices: Sara Plus Ambulation/Gait Assistance: 1: +2 Total assist Locomotion: Ambulation: 1: Two helpers  Comprehension Comprehension Mode: Auditory Comprehension: 5-Follows basic conversation/direction: With no assist  Expression Expression Mode: Verbal Expression: 5-Expresses basic needs/ideas: With no assist  Social Interaction Social Interaction:  5-Interacts appropriately 90% of the time - Needs monitoring or encouragement for participation or interaction.  Problem Solving Problem Solving: 3-Solves basic 50 - 74% of the time/requires cueing 25 - 49% of the time  Memory Memory: 3-Recognizes or recalls 50 - 74% of the time/requires cueing 25 - 49% of the time   1. Functional deficits secondary to left CVA with right-sided weakness  2. DVT Prophylaxis/Anticoagulation: SCDs. Monitor for any signs of DVT  3. Pain Management: Tylenol as needed , R groin pain likely from hip, check R hip Xray, suspect OA no trauma 4. Hypertension. Amlodipine 10 mg daily, lisinopril 40 mg daily, metoprolol 50 mg daily, hydrochlorothiazide 12.5 mg daily.BP on low side, elevated BUN will hold HCTZ Monitor with increased mobility  5. Neuropsych: This patient is capable of making  decisions on her own behalf.  6. Skin/Wound Care: Routine skin checks  7. Diabetes mellitus with peripheral neuropathy. Lantus insulin 25 units daily. Check blood sugars a.c. and at bedtime  8. Hyperlipidemia. Atorvastatin 80 mg daily  9. Urinary tract infection. Cipro 500 mg twice a day completed, monitor for recurrence of symptoms. Continue oxybutynin 5 mg one half tablet twice a day for history of urinary incontinence LOS (Days) 8 A FACE TO FACE EVALUATION WAS PERFORMED  Khaden Gater E 07/22/2014, 11:00 AM

## 2014-07-22 NOTE — Plan of Care (Signed)
Problem: RH BLADDER ELIMINATION Goal: RH STG MANAGE BLADDER WITH ASSISTANCE STG Manage Bladder With min Assistance  Outcome: Not Progressing Voids incont. Of urine

## 2014-07-22 NOTE — Progress Notes (Signed)
Occupational Therapy Session Note  Patient Details  Name: Megan Moore MRN: [id number] Date of Birth: [birth year]  Today's Date: 07/22/2014 OT Individual Time: 1115-1200 OT Individual Time Calculation (min): 45 min    Short Term Goals: Week 1:  OT Short Term Goal 1 (Week 1): Pt will perform LB dressing with Max A in order to increase I with self care OT Short Term Goal 2 (Week 1): Pt will perform shower transfer with Mod A in order to increase I in functional transfers. OT Short Term Goal 3 (Week 1): Pt will perform toilet transfer with Mod A in order to increase I in functional transfers.  OT Short Term Goal 4 (Week 1): Pt will perform toileting with Mod A in order to increase I with self care. OT Short Term Goal 5 (Week 1): Pt perform UB dressing with Min A in order to increase I in self care.   Skilled Therapeutic Interventions/Progress Updates: ADl-retraining at w/c level with emphasis on improved tranfers, NMR of RUE, and static standing balance.   Pt received supine in bed with family present.   Pt receptive for assist with bathing and completed bed mobility and stand-pivot transfer to w/c with max-mod assist.  Pt bathed at sink using wash mit to incorporate RUE during task with improved trunk control to cross midline and wash LLE using mit and hand guidance for initial instruction.   With husband providing min guard assist at her left side, pt stood at sink with OT supporting right side to facilitate pulling up her pants while standing.   Pt returned to w/c with mod assist to lower safely and was left in w/c with her husband attending to grooming needs at end of session.     Therapy Documentation Precautions:  Precautions Precautions: Fall Precaution Comments: pt pushes toward hemiplegic side Restrictions Weight Bearing Restrictions: No  Pain: no/denies pain    See FIM for current functional status  Therapy/Group: Individual Therapy  [name] 07/22/2014, 3:46 PM

## 2014-07-23 ENCOUNTER — Inpatient Hospital Stay (HOSPITAL_COMMUNITY): Payer: Medicare PPO

## 2014-07-23 LAB — GLUCOSE, CAPILLARY
Glucose-Capillary: 85 mg/dL (ref 70–99)
Glucose-Capillary: 119 mg/dL — ABNORMAL HIGH (ref 70–99)
Glucose-Capillary: 202 mg/dL — ABNORMAL HIGH (ref 70–99)
Glucose-Capillary: 170 mg/dL — ABNORMAL HIGH (ref 70–99)

## 2014-07-23 NOTE — Progress Notes (Signed)
Physical Therapy Session Note  Patient Details  Name: Megan Moore MRN: [id number] Date of Birth: August 08, 1941  Today's Date: 07/23/2014 PT Individual Time: 1000-1045 PT Individual Time Calculation (min): 45 min   Short Term Goals: Week 2:  PT Short Term Goal 1 (Week 2): Pt will perform bed<>chair transfers wtih mod A consistently PT Short Term Goal 2 (Week 2): Pt will perform bed mobility with Min A PT Short Term Goal 3 (Week 2): Pt will ambulate x25' with Ax1 person and LRAD PT Short Term Goal 4 (Week 2): Pt will propell WC x150' with S  Skilled Therapeutic Interventions/Progress Updates:   Session focused on neuro re-ed for sitting balance and standing balance/tolerance as well as w/c propulsion. Pt required max A for squat pivot transfer bed -> w/c with cues and manual facilitation for anterior weightshift and to grade her movements as she has tendency to push posterior and extend at trunk. Min A for w/c mobility navigating obstacles in hallway and reports fatigue from activity. In parallel bars, using mirror for visual feedback to achieve midline, focused on sit to stands, standing tolerance/balance, and weightshifting with verbal and manual facilitation by therapist with max A on first 2 trials, but mod A on last 2 trials.   Therapy Documentation Precautions:  Precautions Precautions: Fall Precaution Comments: pt pushes toward hemiplegic side Restrictions Weight Bearing Restrictions: No  Pain: Pt reports pain in R groin area - RN aware.  See FIM for current functional status  Therapy/Group: Individual Therapy  Karolee Stamps [facility] 07/23/2014, 11:36 AM

## 2014-07-23 NOTE — Progress Notes (Signed)
Subjective/Complaints: 73 year old right-handed female with history of hypertension as well as diabetes mellitus with peripheral neuropathy. Presented to [facility] 07/12/2014 with acute onset of right-sided weakness. Cranial CT scan negative. MRI of the brain showed acute infarct left side of the internal capsule as well as remote lacunar infarct of the thalami bilaterally, the left caudate head and posterior right pons. Patient did not receive TPA. Echocardiogram unremarkable. Carotid Dopplers with no ICA stenosis. Neurology services consulted placed on aspirin for CVA prophylaxis. Hospital course urinary tract infection with Cipro initiated 07/12/2014 with end date 07/19/2014  No new issues,R groin pain, relieved by med, discussed xray result- "arthritis" with pt  Review of Systems - Negative except R side weak  Objective: Vital Signs: Blood pressure 116/65, pulse 70, temperature 98.2 F (36.8 C), temperature source Oral, resp. rate 18, weight 60.102 kg (132 lb 8 oz), SpO2 100.00%. Dg Hip 1 View Right  07/22/2014   CLINICAL DATA:  Pain in the right groin with right hip motion. No known injuries.  EXAM: RIGHT HIP - 1 VIEW  COMPARISON:  None.  FINDINGS: AP view of the right hip demonstrates no evidence of acute or subacute fracture or dislocation. Moderate axial joint space narrowing with associated hypertrophic spurring. Bone mineral density relatively well preserved for age. Numerous pelvic phleboliths noted. Visualized sacroiliac joints intact with degenerative changes. Symphysis pubis intact.  IMPRESSION: No acute osseous abnormality.  Moderate osteoarthritis.   Electronically Signed   By: Hulan Saas M.D.   On: 07/22/2014 14:09   Results for orders placed during the hospital encounter of 07/14/14 (from the past 72 hour(s))  GLUCOSE, CAPILLARY     Status: Abnormal   Collection Time    07/20/14 11:34 AM      Result Value Ref Range   Glucose-Capillary 128 (*) 70 -  99 mg/dL   Comment 1 Notify RN    GLUCOSE, CAPILLARY     Status: Abnormal   Collection Time    07/20/14  4:51 PM      Result Value Ref Range   Glucose-Capillary 174 (*) 70 - 99 mg/dL  GLUCOSE, CAPILLARY     Status: Abnormal   Collection Time    07/20/14  8:43 PM      Result Value Ref Range   Glucose-Capillary 165 (*) 70 - 99 mg/dL   Comment 1 Notify RN    GLUCOSE, CAPILLARY     Status: None   Collection Time    07/21/14  6:58 AM      Result Value Ref Range   Glucose-Capillary 88  70 - 99 mg/dL   Comment 1 Notify RN    GLUCOSE, CAPILLARY     Status: Abnormal   Collection Time    07/21/14 12:01 PM      Result Value Ref Range   Glucose-Capillary 203 (*) 70 - 99 mg/dL   Comment 1 Notify RN    GLUCOSE, CAPILLARY     Status: None   Collection Time    07/21/14  4:18 PM      Result Value Ref Range   Glucose-Capillary 98  70 - 99 mg/dL  GLUCOSE, CAPILLARY     Status: Abnormal   Collection Time    07/21/14  8:46 PM      Result Value Ref Range   Glucose-Capillary 147 (*) 70 - 99 mg/dL   Comment 1 Notify RN    GLUCOSE, CAPILLARY     Status: None   Collection Time  07/22/14  7:19 AM      Result Value Ref Range   Glucose-Capillary 87  70 - 99 mg/dL  GLUCOSE, CAPILLARY     Status: None   Collection Time    07/22/14 12:19 PM      Result Value Ref Range   Glucose-Capillary 82  70 - 99 mg/dL   Comment 1 Documented in Chart    GLUCOSE, CAPILLARY     Status: Abnormal   Collection Time    07/22/14  4:34 PM      Result Value Ref Range   Glucose-Capillary 241 (*) 70 - 99 mg/dL  GLUCOSE, CAPILLARY     Status: Abnormal   Collection Time    07/22/14  9:20 PM      Result Value Ref Range   Glucose-Capillary 128 (*) 70 - 99 mg/dL  GLUCOSE, CAPILLARY     Status: None   Collection Time    07/23/14  6:56 AM      Result Value Ref Range   Glucose-Capillary 85  70 - 99 mg/dL   Comment 1 Notify RN       HEENT: normal Cardio: RRR and no murmur Resp: CTA B/L and unlabored GI: BS  positive and NT Extremity:  Pulses positive and No Edema Skin:   Intact and Other dry Neuro: Alert/Oriented, Normal Sensory, Abnormal Motor 2- R delt, bi, tri grip, 1/5 RLE except trace knee ext and hip ext Reflexes: 3+ Musc/Skel:mild groin pain with R hip ROM Gen NAD   Assessment/Plan: 1. 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 Physiatrist is providing close team supervision and 24 hour management of active medical problems listed below. Physiatrist and rehab team continue to assess barriers to discharge/monitor patient progress toward functional and medical goals. FIM: FIM - Bathing Bathing Steps Patient Completed: Chest;Right Arm;Abdomen;Right upper leg;Left upper leg;Right lower leg (including foot);Left lower leg (including foot) Bathing: 3: Mod-Patient completes 5-7 2f 10 parts or 50-74%  FIM - Upper Body Dressing/Undressing Upper body dressing/undressing steps patient completed: Thread/unthread right sleeve of pullover shirt/dresss;Thread/unthread left sleeve of pullover shirt/dress;Put head through opening of pull over shirt/dress Upper body dressing/undressing: 4: Min-Patient completed 75 plus % of tasks FIM - Lower Body Dressing/Undressing Lower body dressing/undressing steps patient completed: Thread/unthread left pants leg Lower body dressing/undressing: 1: Total-Patient completed less than 25% of tasks  FIM - Toileting Toileting steps completed by patient: Performs perineal hygiene Toileting: 2: Max-Patient completed 1 of 3 steps  FIM - Diplomatic Services operational officer Devices: Grab bars Toilet Transfers: 2-To toilet/BSC: Max A (lift and lower assist);2-From toilet/BSC: Max A (lift and lower assist)  FIM - Banker Devices: Arm rests Bed/Chair Transfer: 2: Chair or W/C >  Bed: Max A (lift and lower assist);2: Sit > Supine: Max A (lifting assist/Pt. 25-49%)  FIM - Locomotion: Wheelchair Distance: 75 Locomotion: Wheelchair: 2: Travels 50 - 149 ft with supervision, cueing or coaxing FIM - Locomotion: Ambulation Locomotion: Ambulation Assistive Devices: Sara Plus Ambulation/Gait Assistance: 1: +2 Total assist Locomotion: Ambulation: 1: Two helpers  Comprehension Comprehension Mode: Auditory Comprehension: 5-Follows basic conversation/direction: With no assist  Expression Expression Mode: Verbal Expression: 5-Expresses basic needs/ideas: With no assist  Social Interaction Social Interaction: 5-Interacts appropriately 90% of the time - Needs monitoring or encouragement for participation or interaction.  Problem Solving Problem Solving: 3-Solves basic 50 - 74%  of the time/requires cueing 25 - 49% of the time  Memory Memory: 3-Recognizes or recalls 50 - 74% of the time/requires cueing 25 - 49% of the time   1. Functional deficits secondary to left CVA with right-sided weakness  2. DVT Prophylaxis/Anticoagulation: SCDs. Monitor for any signs of DVT  3. Pain Management: Tylenol as needed , R groin pain likely from hip, 9/19 R hip Xray,showing mod OA 4. Hypertension. Amlodipine 10 mg daily, lisinopril 40 mg daily, metoprolol 50 mg daily, hydrochlorothiazide 12.5 mg daily.BP on low side, elevated BUN will hold HCTZ Monitor with increased mobility  5. Neuropsych: This patient is capable of making decisions on her own behalf.  6. Skin/Wound Care: Routine skin checks  7. Diabetes mellitus with peripheral neuropathy. Lantus insulin 25 units daily. Check blood sugars a.c. and at bedtime  8. Hyperlipidemia. Atorvastatin 80 mg daily  9. Urinary tract infection. Cipro 500 mg twice a day completed, monitor for recurrence of symptoms. Continue oxybutynin 5 mg one half tablet twice a day for history of urinary incontinence LOS (Days) 9 A FACE TO FACE EVALUATION WAS  PERFORMED  -+  KIRSTEINS,ANDREW E 07/23/2014, 9:57 AM

## 2014-07-24 ENCOUNTER — Inpatient Hospital Stay (HOSPITAL_COMMUNITY): Payer: Medicare PPO

## 2014-07-24 ENCOUNTER — Inpatient Hospital Stay (HOSPITAL_COMMUNITY): Payer: Medicare PPO | Admitting: Speech Pathology

## 2014-07-24 ENCOUNTER — Inpatient Hospital Stay (HOSPITAL_COMMUNITY): Payer: Medicare PPO | Admitting: Occupational Therapy

## 2014-07-24 LAB — GLUCOSE, CAPILLARY
GLUCOSE-CAPILLARY: 82 mg/dL (ref 70–99)
GLUCOSE-CAPILLARY: 129 mg/dL — AB (ref 70–99)
GLUCOSE-CAPILLARY: 171 mg/dL — AB (ref 70–99)
Glucose-Capillary: 146 mg/dL — ABNORMAL HIGH (ref 70–99)

## 2014-07-24 NOTE — Progress Notes (Signed)
Occupational Therapy Weekly Progress Note  Patient Details  Name: Megan Moore MRN: [id number] Date of Birth: 10-13-1941  Beginning of progress report period: July 15, 2014 End of progress report period: July 24, 2014  Today's Date: 07/24/2014 OT Individual Time: 1020-1105 OT Individual Time Calculation (min): 45 min    Patient has met 3 of 5 short term goals.  Pt did not meet shower goal as we have not worked on that yet this week, pt continues to have difficulty with sitting balance and showering is not safe at this time. It is a goal for her next week. Pt also partly met her toilet transfer goal. She can transfer to her L using a grab bar with mod A, but is not yet consistent to transfer to her R with mod A.  She has made excellent progress with her RUE AROM. She now has full finger flexion and 75% of finger extension with a 3+/5 grasp, 2+/5 elbow flex and ext and shoulder abd AROM to 50 degrees.  Patient continues to demonstrate the following deficits: posterior lean/ push during transfers, pushes to the R in standing, decreased postural control, R hemiplegia and therefore will continue to benefit from skilled OT intervention to enhance overall performance with BADL.  Patient progressing toward long term goals..  Continue plan of care.  OT Short Term Goals Week 1:  OT Short Term Goal 1 (Week 1): Pt will perform LB dressing with Max A in order to increase I with self care OT Short Term Goal 1 - Progress (Week 1): Met OT Short Term Goal 2 (Week 1): Pt will perform shower transfer with Mod A in order to increase I in functional transfers. OT Short Term Goal 2 - Progress (Week 1): Other (comment) (We have not tried shower transfers yet, due to decreased sitting balance.) OT Short Term Goal 3 (Week 1): Pt will perform toilet transfer with Mod A in order to increase I in functional transfers.  OT Short Term Goal 3 - Progress (Week 1): Met OT Short Term Goal 4 (Week 1): Pt will  perform toileting with Mod A in order to increase I with self care. OT Short Term Goal 4 - Progress (Week 1): Partly met OT Short Term Goal 5 (Week 1): Pt perform UB dressing with Min A in order to increase I in self care.  OT Short Term Goal 5 - Progress (Week 1): Met Week 2:  OT Short Term Goal 1 (Week 2): Pt will be able to transfer onto a tub bench in the walk in shower with mod A using grab bars and a squat pivot. OT Short Term Goal 2 (Week 2): Pt will be able to stand at the sink with min- mod A (versus mod-max A) to increase Independence with LB self care. OT Short Term Goal 3 (Week 2): Pt will demonstrate improved functional use of RUE to wash L arm with min A. OT Short Term Goal 4 (Week 2): Pt will be able to don pants over R foot and pull pants over her hips while being supported in standing.  Skilled Therapeutic Interventions/Progress Updates:  Pt seen for BADL retraining with a focus on safe functional transfers out of bed, sit to stand numerous times w/c to sink during LB self care, static standing with postural awareness at sink. Pt is now able to use R arm more actively so she was able to don her shirt without A, hold a washcloth in her hand and wash R arm  L arm with mod A.  Pt responds well to cues, but tends to move too quickly. Her PT arrived for her next session.  Continue OT 5-7 days a week 1-2x a day for 60-90 minutes with Balance/vestibular training;Cognitive remediation/compensation;Discharge planning;DME/adaptive equipment instruction;Functional mobility training;Psychosocial support;Therapeutic Activities;UE/LE Strength taining/ROM;Visual/perceptual remediation/compensation;UE/LE Coordination activities;Therapeutic Exercise;Self Care/advanced ADL retraining;Patient/family education;Neuromuscular re-education to maximize her independence in self care.  Therapy Documentation Precautions:  Precautions Precautions: Fall Precaution Comments: pt pushes toward hemiplegic  side Restrictions Weight Bearing Restrictions: No   Pain: Pain Assessment Pain Assessment: No/denies pain ADL:   See FIM for current functional status  Therapy/Group: Individual Therapy  SAGUIER,JULIA 07/24/2014, 11:45 AM

## 2014-07-24 NOTE — Progress Notes (Signed)
Speech Language Pathology Daily Session Note  Patient Details  Name: Megan Moore MRN: [id number] Date of Birth: [birth year]  Today's Date: 07/24/2014 SLP Individual Time: 7829-5621 SLP Individual Time Calculation (min): 45 min  Short Term Goals: Week 2: SLP Short Term Goal 1 (Week 2): Pt will improve selective attention to complete functional tasks in a moderately distracting environment for 20-30 minutes with supervision cues to redirect.  SLP Short Term Goal 2 (Week 2): Pt wiil improve semi-complex problem solving for medication management for 80% accuracy with supervision. SLP Short Term Goal 3 (Week 2): Pt will improve use of external aids to facilitate improved recall of new, complex information for 80% accuracy with supervision   Skilled Therapeutic Interventions:  Pt was seen for skilled speech therapy targeting cognitive goals.  Upon arrival pt was upright in bed, awake, alert, and agreeable to participate in ST. SLP facilitated the session with a structured problem solving task targeting basic money management with pt requiring overall min-mod assist for planning, organization, and working memory to complete task for 80% accuracy.  Pt also required min assist to return demonstration of therapy schedule to improve prospective memory for daily events and information.  Continue per current plan of care.    FIM:  Comprehension Comprehension Mode: Auditory Comprehension: 5-Follows basic conversation/direction: With no assist Expression Expression Mode: Verbal Expression: 5-Expresses basic needs/ideas: With no assist Social Interaction Social Interaction: 5-Interacts appropriately 90% of the time - Needs monitoring or encouragement for participation or interaction. Problem Solving Problem Solving: 3-Solves basic 50 - 74% of the time/requires cueing 25 - 49% of the time Memory Memory: 3-Recognizes or recalls 50 - 74% of the time/requires cueing 25 - 49% of the time  Pain Pain  Assessment Pain Assessment: No/denies pain  Therapy/Group: Individual Therapy  Jackalyn Lombard, M.A. CCC-SLP  Graig Hessling, Melanee Spry 07/24/2014, 12:24 PM

## 2014-07-24 NOTE — Progress Notes (Signed)
Physical Therapy Session Note  Patient Details  Name: Megan Moore MRN: [id number] Date of Birth: 07-Jan-1941  Today's Date: 07/24/2014 PT Individual Time: 1100-1200, 1510-1545 PT Individual Time Calculation (min): 60 min, 35 min   Short Term Goals: 1. Pt will transfer bed>< w/c with mod assist consistently. 2. Pt will perform bed mobility with min assist. 3. Pt will perform gait x 25' with assistance of 1 person, LRAD 4. Pt will propel  W/c x 150' with supervision.  Skilled Therapeutic Interventions/Progress Updates:  tx 1: w/c propulsion x 46' with supervision, using hemi method.  W/c> mat transfer to L SBT, with max assist.  Pt continues to move very ballistically and with great effort, increasing her tendency to push toward hemi side.  Neuro re-ed via forced use, manual and VCs for: Sit>< stand from raised mat, wearing RAFO, without use of UEs x 3. RLE step/taps onto 4" and 2" high stools, while leaning L against sturdy table. Bil terminal hip extension and L><R wt shifting while standing in static stander. +2 gait with EVA walker x 10' with second person steering EVA.     Pusher tendencies continue to limit her. Pt is able to progress RLe independently, but has difficulty unweighting RLE.  Tx 2:  neuromuscular re-education via demo, VCs, visual cues for bil UE reach far forward and hip flexion to transfer weight forward for transfer. In supine, bil bridging with knees adducted x 5, with improved activation RLe.  W/c>< double bed in ADL apt; squat pivot using Bobath method with mod assist.  Sit> supine through L sidelying on ADL bed with min assist for RLe; through R sidelying on hospital bed with supervision. Supine> sit with supervision to L or R.  Pt returned to room and transferred to bed as above.  Scooting to [facility] with min assist to stabilize her RLE.    Therapy Documentation Precautions:  Precautions Precautions: Fall Precaution Comments: pt pushes toward hemiplegic  side Restrictions Weight Bearing Restrictions: No   Pain: Pain Assessment Pain Assessment: No/denies pain   Locomotion : Ambulation Ambulation/Gait Assistance: 1: +2 Total assist       See FIM for current functional status  Therapy/Group: Individual Therapy  Atiyana Welte 07/24/2014, 12:18 PM

## 2014-07-24 NOTE — Progress Notes (Signed)
Subjective/Complaints: 73 year old right-handed female with history of hypertension as well as diabetes mellitus with peripheral neuropathy. Presented to [facility] 07/12/2014 with acute onset of right-sided weakness. Cranial CT scan negative. MRI of the brain showed acute infarct left side of the internal capsule as well as remote lacunar infarct of the thalami bilaterally, the left caudate head and posterior right pons. Patient did not receive TPA. Echocardiogram unremarkable. Carotid Dopplers with no ICA stenosis. Neurology services consulted placed on aspirin for CVA prophylaxis. Hospital course urinary tract infection with Cipro initiated 07/12/2014 with end date 07/19/2014  No new issues overnite No sig hip pain  Review of Systems - Negative except R side weak  Objective: Vital Signs: Blood pressure 114/55, pulse 59, temperature 97.9 F (36.6 C), temperature source Oral, resp. rate 18, weight 60.102 kg (132 lb 8 oz), SpO2 100.00%. Dg Hip 1 View Right  07/22/2014   CLINICAL DATA:  Pain in the right groin with right hip motion. No known injuries.  EXAM: RIGHT HIP - 1 VIEW  COMPARISON:  None.  FINDINGS: AP view of the right hip demonstrates no evidence of acute or subacute fracture or dislocation. Moderate axial joint space narrowing with associated hypertrophic spurring. Bone mineral density relatively well preserved for age. Numerous pelvic phleboliths noted. Visualized sacroiliac joints intact with degenerative changes. Symphysis pubis intact.  IMPRESSION: No acute osseous abnormality.  Moderate osteoarthritis.   Electronically Signed   By: Hulan Saas M.D.   On: 07/22/2014 14:09   Results for orders placed during the hospital encounter of 07/14/14 (from the past 72 hour(s))  GLUCOSE, CAPILLARY     Status: Abnormal   Collection Time    07/21/14 12:01 PM      Result Value Ref Range   Glucose-Capillary 203 (*) 70 - 99 mg/dL   Comment 1 Notify RN    GLUCOSE,  CAPILLARY     Status: None   Collection Time    07/21/14  4:18 PM      Result Value Ref Range   Glucose-Capillary 98  70 - 99 mg/dL  GLUCOSE, CAPILLARY     Status: Abnormal   Collection Time    07/21/14  8:46 PM      Result Value Ref Range   Glucose-Capillary 147 (*) 70 - 99 mg/dL   Comment 1 Notify RN    GLUCOSE, CAPILLARY     Status: None   Collection Time    07/22/14  7:19 AM      Result Value Ref Range   Glucose-Capillary 87  70 - 99 mg/dL  GLUCOSE, CAPILLARY     Status: None   Collection Time    07/22/14 12:19 PM      Result Value Ref Range   Glucose-Capillary 82  70 - 99 mg/dL   Comment 1 Documented in Chart    GLUCOSE, CAPILLARY     Status: Abnormal   Collection Time    07/22/14  4:34 PM      Result Value Ref Range   Glucose-Capillary 241 (*) 70 - 99 mg/dL  GLUCOSE, CAPILLARY     Status: Abnormal   Collection Time    07/22/14  9:20 PM      Result Value Ref Range   Glucose-Capillary 128 (*) 70 - 99 mg/dL  GLUCOSE, CAPILLARY     Status: None   Collection Time    07/23/14  6:56 AM      Result Value Ref Range   Glucose-Capillary 85  70 - 99  mg/dL   Comment 1 Notify RN    GLUCOSE, CAPILLARY     Status: Abnormal   Collection Time    07/23/14 11:53 AM      Result Value Ref Range   Glucose-Capillary 119 (*) 70 - 99 mg/dL  GLUCOSE, CAPILLARY     Status: Abnormal   Collection Time    07/23/14  4:24 PM      Result Value Ref Range   Glucose-Capillary 202 (*) 70 - 99 mg/dL  GLUCOSE, CAPILLARY     Status: Abnormal   Collection Time    07/23/14  8:48 PM      Result Value Ref Range   Glucose-Capillary 170 (*) 70 - 99 mg/dL   Comment 1 Notify RN    GLUCOSE, CAPILLARY     Status: None   Collection Time    07/24/14  7:38 AM      Result Value Ref Range   Glucose-Capillary 82  70 - 99 mg/dL     HEENT: normal Cardio: RRR and no murmur Resp: CTA B/L and unlabored GI: BS positive and NT Extremity:  Pulses positive and No Edema Skin:   Intact and Other dry Neuro:  Alert/Oriented, Normal Sensory, Abnormal Motor 2- R delt, bi, tri grip, 1/5 RLE except trace knee ext and hip ext Reflexes: 3+ Musc/Skel:mild groin pain with R hip ROM Gen NAD   Assessment/Plan: 1. 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 Physiatrist is providing close team supervision and 24 hour management of active medical problems listed below. Physiatrist and rehab team continue to assess barriers to discharge/monitor patient progress toward functional and medical goals. FIM: FIM - Bathing Bathing Steps Patient Completed: Chest;Right Arm;Abdomen;Front perineal area Bathing: 2: Max-Patient completes 3-4 4f 10 parts or 25-49%  FIM - Upper Body Dressing/Undressing Upper body dressing/undressing steps patient completed: Put head through opening of pull over shirt/dress;Thread/unthread left sleeve of pullover shirt/dress Upper body dressing/undressing: 3: Mod-Patient completed 50-74% of tasks FIM - Lower Body Dressing/Undressing Lower body dressing/undressing steps patient completed: Thread/unthread left pants leg Lower body dressing/undressing: 1: Total-Patient completed less than 25% of tasks  FIM - Toileting Toileting steps completed by patient: Performs perineal hygiene Toileting: 2: Max-Patient completed 1 of 3 steps  FIM - Diplomatic Services operational officer Devices: Grab bars Toilet Transfers: 2-To toilet/BSC: Max A (lift and lower assist);2-From toilet/BSC: Max A (lift and lower assist)  FIM - Press photographer Assistive Devices: Arm rests Bed/Chair Transfer: 3: Supine > Sit: Mod A (lifting assist/Pt. 50-74%/lift 2 legs;2: Bed > Chair or W/C: Max A (lift and lower assist)  FIM - Locomotion: Wheelchair Distance: 75 Locomotion: Wheelchair: 2: Travels 50 - 149 ft with minimal assistance (Pt.>75%) FIM  - Locomotion: Ambulation Locomotion: Ambulation Assistive Devices: Sara Plus Ambulation/Gait Assistance: 1: +2 Total assist Locomotion: Ambulation: 1: Two helpers  Comprehension Comprehension Mode: Auditory Comprehension: 5-Follows basic conversation/direction: With no assist  Expression Expression Mode: Verbal Expression: 5-Expresses basic needs/ideas: With no assist  Social Interaction Social Interaction: 5-Interacts appropriately 90% of the time - Needs monitoring or encouragement for participation or interaction.  Problem Solving Problem Solving: 3-Solves basic 50 - 74% of the time/requires cueing 25 - 49% of the time  Memory Memory: 3-Recognizes or recalls 50 - 74% of the time/requires cueing 25 - 49% of the time   1. Functional deficits secondary to left CVA  with right-sided weakness  2. DVT Prophylaxis/Anticoagulation: SCDs. Monitor for any signs of DVT  3. Pain Management: Tylenol as needed , R groin pain likely from hip, 9/19 R hip Xray,showing mod OA 4. Hypertension. Amlodipine 10 mg daily, lisinopril 40 mg daily, metoprolol 50 mg daily, hydrochlorothiazide 12.5 mg daily.BP on low side, elevated BUN will hold HCTZ Monitor with increased mobility  5. Neuropsych: This patient is capable of making decisions on her own behalf.  6. Skin/Wound Care: Routine skin checks  7. Diabetes mellitus with peripheral neuropathy. Lantus insulin 25 units daily. Check blood sugars a.c. and at bedtime  8. Hyperlipidemia. Atorvastatin 80 mg daily  9. Urinary tract infection. Cipro 500 mg twice a day completed, monitor for recurrence of symptoms. Continue oxybutynin 5 mg one half tablet twice a day for history of urinary incontinence LOS (Days) 10 A FACE TO FACE EVALUATION WAS PERFORMED  -+  Ismelda Weatherman E 07/24/2014, 8:14 AM

## 2014-07-25 ENCOUNTER — Inpatient Hospital Stay (HOSPITAL_COMMUNITY): Payer: Medicare PPO | Admitting: *Deleted

## 2014-07-25 ENCOUNTER — Inpatient Hospital Stay (HOSPITAL_COMMUNITY): Payer: Medicare PPO | Admitting: Physical Therapy

## 2014-07-25 ENCOUNTER — Inpatient Hospital Stay (HOSPITAL_COMMUNITY): Payer: Medicare PPO | Admitting: Occupational Therapy

## 2014-07-25 LAB — GLUCOSE, CAPILLARY
GLUCOSE-CAPILLARY: 165 mg/dL — AB (ref 70–99)
Glucose-Capillary: 68 mg/dL — ABNORMAL LOW (ref 70–99)
Glucose-Capillary: 106 mg/dL — ABNORMAL HIGH (ref 70–99)
Glucose-Capillary: 198 mg/dL — ABNORMAL HIGH (ref 70–99)
Glucose-Capillary: 161 mg/dL — ABNORMAL HIGH (ref 70–99)

## 2014-07-25 MED ORDER — INSULIN GLARGINE 100 UNIT/ML ~~LOC~~ SOLN
23.0000 [IU] | Freq: Every day | SUBCUTANEOUS | Status: DC
  Administered 2014-07-25 – 2014-08-01 (×8): 23 [IU] via SUBCUTANEOUS
  Filled 2014-07-25 (×11): qty 0.23

## 2014-07-25 NOTE — Progress Notes (Signed)
Occupational Therapy Session Note  Patient Details  Name: Megan Moore MRN: [id number] Date of Birth: 1940-11-20  Today's Date: 07/25/2014 OT Individual Time: 1300-1400 OT Individual Time Calculation (min): 60 min   Short Term Goals: Week 2:  OT Short Term Goal 1 (Week 2): Pt will be able to transfer onto a tub bench in the walk in shower with mod A using grab bars and a squat pivot. OT Short Term Goal 2 (Week 2): Pt will be able to stand at the sink with min- mod A (versus mod-max A) to increase Independence with LB self care. OT Short Term Goal 3 (Week 2): Pt will demonstrate improved functional use of RUE to wash L arm with min A. OT Short Term Goal 4 (Week 2): Pt will be able to don pants over R foot and pull pants over her hips while being supported in standing.  Skilled Therapeutic Interventions/Progress Updates:  Patient seated in w/c with husband, sister and another family member visiting.  Engaged in activity tolerance, sit><stands 8 times, standing balance and standing tolerance, weight shifting without moving feet, postural control, forced use of RUE & RLE, and hands on caregiver education with husband to simulate standing during clothing management.  Patient's assist level during above tasks varied from supervision when holding onto sink in standing to max assist when "pusher syndrome" tendencies present as patient fatigued, when she distracted and when she perceived that she was falling.  Husband reports that he has been observing only and was willing to practice hands on with his wife to include sit><stand, standing balance while patient performed clothes down and up.  This OT occasionall assisted with stand>sit secondary to patient demonstrating "pushing" and would otherwise sit half on the seat and half off the seat.  Patient and husband reported feeling better about how well she did however knowing that patient still requires more therapy and time to progress.  Recommend more hands  on training with husband.  Provided and discussed "Pusher Syndrome" hand out to patient and husband.  Sister came back in at end of session and was asked to review this hand out as well.  Therapy Documentation Precautions:  Precautions Precautions: Fall Precaution Comments: pt pushes toward hemiplegic side Restrictions Weight Bearing Restrictions: No Pain: No report of pain ADL: See FIM for current functional status  Therapy/Group: Individual Therapy  Shamond Skelton 07/25/2014, 6:00 PM

## 2014-07-25 NOTE — Progress Notes (Signed)
Physical Therapy Session Note  Patient Details  Name: Megan Moore MRN: [id number] Date of Birth: [birth year]  Today's Date: 07/25/2014 PT Individual Time: 0900-1000 PT Individual Time Calculation (min): 60 min   Short Term Goals: Week 1:  PT Short Term Goal 1 (Week 1): Pt to perform bed mobility with mod Ax1person without use of hospital bed functions PT Short Term Goal 1 - Progress (Week 1): Progressing toward goal PT Short Term Goal 2 (Week 1): Pt to perform bed<>w/c transfers with mod Ax1person PT Short Term Goal 2 - Progress (Week 1): Progressing toward goal PT Short Term Goal 3 (Week 1): Pt to maintain dynamic sitting balance with min Ax1person PT Short Term Goal 3 - Progress (Week 1): Met PT Short Term Goal 4 (Week 1): Pt to propel w/c 75' with min Ax1person PT Short Term Goal 4 - Progress (Week 1): Met PT Short Term Goal 5 (Week 1): Pt to ambulate 10' with LRAD and Ax2persons PT Short Term Goal 5 - Progress (Week 1): Met Week 2:  PT Short Term Goal 1 (Week 2): Pt will perform bed<>chair transfers wtih mod A consistently PT Short Term Goal 2 (Week 2): Pt will perform bed mobility with Min A PT Short Term Goal 3 (Week 2): Pt will ambulate x25' with Ax1 person and LRAD PT Short Term Goal 4 (Week 2): Pt will propell WC x150' with S  Skilled Therapeutic Interventions/Progress Updates:  Tx focused on functional mobility training and NMR via forced use, manual facilitation, and multi-modal cues with mirror at hall rail. Pt encouraged to problem solve and direct self cares for components of self-care with which she needs assist.   Pt up in [facility], assisted with LB dressing at sink with up to [place] to stand. Pt propelled WC x100' with S and cues for efficiency. Pt continues to need safety cues for [facility] management and parts.  Performed multiple sit<>stand transfers throughout with cues for SLOW and anterior leaning. Pt continues to descend without control, bumping RLE as she goes.  Discussed fall risk and safety AT LENGTH.   Performed 5 reps static and dynamic standing tasks x1-15min at L hall rail with mirror for visual feedback. Therapist assisted with pt's R arm over shoulder (sitting on stool) and blocking R knee. Pt consistently cued to move slowly and with controlled/graded motions, tuck hips, and use ant translation to control sit<>stand. Performed each of the following in standing: - static standing with/without L UE support - Mod/max A due to posterior lean - lateral weight shifts - max A to R - knee bends x8 - max A  - Step taps with LLE to yoga block x5; and static LLE on block x45 sec - pre-gait step throughs with R/LLE with AFO and shoes.  Pt able to ambulate 8' with mirror, AFO, L hand rail and therapist assist Max at RLE and trunk. Pt able to advance RLE, but needed assist to stabilize during L step, as well as maintaining hip ext.   Pt left up in [facility] with lap belt and all needs in reach, feeling good about her progress today.      Therapy Documentation Precautions:  Precautions Precautions: Fall Precaution Comments: pt pushes toward hemiplegic side Restrictions Weight Bearing Restrictions: No Pain: Pain Assessment Pain Assessment: No/denies pain Locomotion : Ambulation Ambulation/Gait Assistance: 2: Max Financial controller Distance: 100   See FIM for current functional status  Therapy/Group: Individual Therapy Kennieth Rad, PT, DPT   Dwaine Deter, Landry Mellow  M 07/25/2014, 12:43 PM

## 2014-07-25 NOTE — Progress Notes (Signed)
Subjective/Complaints: 73 year old right-handed female with history of hypertension as well as diabetes mellitus with peripheral neuropathy. Presented to [facility] 07/12/2014 with acute onset of right-sided weakness. Cranial CT scan negative. MRI of the brain showed acute infarct left side of the internal capsule as well as remote lacunar infarct of the thalami bilaterally, the left caudate head and posterior right pons. Patient did not receive TPA. Echocardiogram unremarkable. Carotid Dopplers with no ICA stenosis. Neurology services consulted placed on aspirin for CVA prophylaxis. Hospital course urinary tract infection with Cipro initiated 07/12/2014 with end date 07/19/2014  Denies problems overnite, no pain issues Good day in PT/OT yest  Review of Systems - Negative except R side weak  Objective: Vital Signs: Blood pressure 124/63, pulse 68, temperature 97.8 F (36.6 C), temperature source Oral, resp. rate 18, weight 60.102 kg (132 lb 8 oz), SpO2 100.00%. No results found. Results for orders placed during the hospital encounter of 07/14/14 (from the past 72 hour(s))  GLUCOSE, CAPILLARY     Status: None   Collection Time    07/22/14 12:19 PM      Result Value Ref Range   Glucose-Capillary 82  70 - 99 mg/dL   Comment 1 Documented in Chart    GLUCOSE, CAPILLARY     Status: Abnormal   Collection Time    07/22/14  4:34 PM      Result Value Ref Range   Glucose-Capillary 241 (*) 70 - 99 mg/dL  GLUCOSE, CAPILLARY     Status: Abnormal   Collection Time    07/22/14  9:20 PM      Result Value Ref Range   Glucose-Capillary 128 (*) 70 - 99 mg/dL  GLUCOSE, CAPILLARY     Status: None   Collection Time    07/23/14  6:56 AM      Result Value Ref Range   Glucose-Capillary 85  70 - 99 mg/dL   Comment 1 Notify RN    GLUCOSE, CAPILLARY     Status: Abnormal   Collection Time    07/23/14 11:53 AM      Result Value Ref Range   Glucose-Capillary 119 (*) 70 - 99 mg/dL   GLUCOSE, CAPILLARY     Status: Abnormal   Collection Time    07/23/14  4:24 PM      Result Value Ref Range   Glucose-Capillary 202 (*) 70 - 99 mg/dL  GLUCOSE, CAPILLARY     Status: Abnormal   Collection Time    07/23/14  8:48 PM      Result Value Ref Range   Glucose-Capillary 170 (*) 70 - 99 mg/dL   Comment 1 Notify RN    GLUCOSE, CAPILLARY     Status: None   Collection Time    07/24/14  7:38 AM      Result Value Ref Range   Glucose-Capillary 82  70 - 99 mg/dL  GLUCOSE, CAPILLARY     Status: Abnormal   Collection Time    07/24/14 12:20 PM      Result Value Ref Range   Glucose-Capillary 129 (*) 70 - 99 mg/dL  GLUCOSE, CAPILLARY     Status: Abnormal   Collection Time    07/24/14  4:02 PM      Result Value Ref Range   Glucose-Capillary 146 (*) 70 - 99 mg/dL  GLUCOSE, CAPILLARY     Status: Abnormal   Collection Time    07/24/14  8:52 PM      Result Value Ref Range  Glucose-Capillary 171 (*) 70 - 99 mg/dL  GLUCOSE, CAPILLARY     Status: Abnormal   Collection Time    07/25/14  7:30 AM      Result Value Ref Range   Glucose-Capillary 68 (*) 70 - 99 mg/dL     HEENT: normal Cardio: RRR and no murmur Resp: CTA B/L and unlabored GI: BS positive and NT Extremity:  Pulses positive and No Edema Skin:   Intact and Other dry Neuro: Alert/Oriented, Normal Sensory, Abnormal Motor 2- R delt, bi, tri grip, 1/5 RLE except trace knee ext and hip ext Reflexes: 3+ Musc/Skel:mild groin pain with R hip ROM Gen NAD   Assessment/Plan: 1. 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 Physiatrist is providing close team supervision and 24 hour management of active medical problems listed below. Physiatrist and rehab team continue to assess barriers to discharge/monitor patient progress toward functional and medical goals. FIM: FIM -  Bathing Bathing Steps Patient Completed: Chest;Right Arm;Abdomen;Front perineal area;Buttocks;Right upper leg;Left upper leg Bathing: 3: Mod-Patient completes 5-7 68f 10 parts or 50-74%  FIM - Upper Body Dressing/Undressing Upper body dressing/undressing steps patient completed: Put head through opening of pull over shirt/dress;Thread/unthread left sleeve of pullover shirt/dress;Pull shirt over trunk;Thread/unthread right sleeve of pullover shirt/dresss Upper body dressing/undressing: 5: Supervision: Safety issues/verbal cues FIM - Lower Body Dressing/Undressing Lower body dressing/undressing steps patient completed: Thread/unthread left underwear leg;Thread/unthread left pants leg Lower body dressing/undressing: 2: Max-Patient completed 25-49% of tasks  FIM - Toileting Toileting steps completed by patient: Performs perineal hygiene Toileting: 2: Max-Patient completed 1 of 3 steps  FIM - Diplomatic Services operational officer Devices: Grab bars Toilet Transfers: 2-To toilet/BSC: Max A (lift and lower assist);2-From toilet/BSC: Max A (lift and lower assist)  FIM - Banker Devices: Sliding board Bed/Chair Transfer: 2: Chair or W/C > Bed: Max A (lift and lower assist)  FIM - Locomotion: Wheelchair Distance: 75 Locomotion: Wheelchair: 2: Travels 50 - 149 ft with supervision, cueing or coaxing FIM - Locomotion: Ambulation Locomotion: Ambulation Assistive Devices: Other (comment) (none) Ambulation/Gait Assistance: 1: +2 Total assist Locomotion: Ambulation: 1: Two helpers  Comprehension Comprehension Mode: Auditory Comprehension: 5-Follows basic conversation/direction: With no assist  Expression Expression Mode: Verbal Expression: 5-Expresses basic needs/ideas: With no assist  Social Interaction Social Interaction: 5-Interacts appropriately 90% of the time - Needs monitoring or encouragement for participation or interaction.  Problem  Solving Problem Solving: 3-Solves basic 50 - 74% of the time/requires cueing 25 - 49% of the time  Memory Memory: 3-Recognizes or recalls 50 - 74% of the time/requires cueing 25 - 49% of the time   1. Functional deficits secondary to left CVA with right-sided weakness  2. DVT Prophylaxis/Anticoagulation: SCDs. Monitor for any signs of DVT  3. Pain Management: Tylenol as needed , R groin pain likely from hip, 9/19 R hip Xray,showing mod OA 4. Hypertension. Amlodipine 10 mg daily, lisinopril 40 mg daily, metoprolol 50 mg daily, hydrochlorothiazide 12.5 mg daily.BP on low side, elevated BUN will hold HCTZ Monitor with increased mobility  5. Neuropsych: This patient is capable of making decisions on her own behalf.  6. Skin/Wound Care: Routine skin checks  7. Diabetes mellitus with peripheral neuropathy. Lantus insulin 25 units daily, will reduce slightly secondary to low am CBG. Check blood sugars a.c. and at bedtime  8. Hyperlipidemia. Atorvastatin 80 mg daily  9. Urinary tract infection. Cipro 500 mg twice a day completed, monitor for recurrence of symptoms. Continue oxybutynin 5 mg one half tablet twice a day for history of urinary incontinence LOS (Days) 11 A FACE TO FACE EVALUATION WAS PERFORMED    Tiago Humphrey E 07/25/2014, 8:07 AM

## 2014-07-25 NOTE — Progress Notes (Signed)
[facility] Progress Note Patient Details:  Megan Moore 28-Aug-1941 [id number] Advanced called about brace Patient ID: [name], female   DOB: [birth year], 73 y.o.   MRN: [id number]   Orie Rout 07/25/2014, 3:31 PM

## 2014-07-25 NOTE — Evaluation (Addendum)
Physical Therapy Vestibular Assessment and Plan  Patient Details  Name: Megan Moore MRN: [id number] Date of Birth: 08-25-1941  PT Diagnosis: Vertigo of central origin, BPPV  Today's Date: 07/25/2014 PT Individual Time: 4098-1191 PT Individual Time Calculation (min): 65 min    Problem List:  Patient Active Problem List   Diagnosis Date Noted  . CVA (cerebral infarction) 07/14/2014    Past Medical History: No past medical history on file. Past Surgical History: No past surgical history on file.  Subjective  Pt reports 0/10 subjective dizziness/disequilibrium at baseline, which increases to 5/10 dizziness described as feeling "drunk" upon getting OOB. Onset of subjective dizziness was approximately 3-4 months ago. Pt also perceives disequilibrium in standing described as feeling "like I'm fixing to fall," per pt. Difficult to discern difference between primary and secondary pt-perceived dizziness/disequilibrium during history; however, symptoms are mitigated with sitting down, resting. [name] close friend/neighbor (present), reports pt's balance has been "off" for ~1 year and that pt has sustained frequent falls.   Evaluation Hearing Diminished in R ear  Eye Alignment  Grossly WNL  Spontaneous  Nystagmus  Not observed  Gaze holding nystagmus  Not observed  Smooth pursuit  Abnormal. - Biocular vision: both eyes consistently lose target to R of midline. L eye tracks to R of midline with R vision blocked. - R eye (monocular): decreased ROM with tracking laterally>vertically>medially.  Oculomotor  - Convergence appears WNL with biocular vision; however, noted limited R eye adduction with R monocular vision. - No diplopia.  Saccades  Abnormal. - R eye (monocular): hypermetric vertical saccades (inferior>superior) and hypometric horizontal saccades.  VOR cancellation Abnormal and symptomatic in bilat eyes with both monocular and biocular vision.  VOR slow  - Horizontal head turns:  abnormal, asymptomatic in bilat eyes with monocular vision. - Horizontal/vertical head turns: abnormal, symptomatic in bilateral eyes with biocular vision. - Unable to assess monocular vision with vertical head turns due to decreased pt tolerance to testing.  Head Thrust Test  R (+) and symptomatic  * Modified Rt. Hallpike Dix  Modified by placing bed in 15 degrees Trendelenburg, placing pillow under thoracic spine for addition 15 degrees of cervical extension. (+) for nystagmus and "room spinning" vertigo; however, difficult to visualize direction or torsional component of nystagmus due to pt closing eyes.  * Modified Lt. Gilberto Better  See above for modified testing position. (-) and asymptomatic.  * Modified Rt. Roll Test  Modified by placing bed in 30 degrees reverse Trendelenburg. (-) and asymptomatic.  * Modified Lt. Roll Test  See above for modified testing position. (-) and asymptomatic.  Motion sensitivity  (+) supine>sit; unable rule out orthostatic hypotension due to time constraint  Head Shaking Nystagmus Not formally assessed  * Modified due to decreased pt tolerance of head movement following assessments above.  Visual-Vestibular Interactions - Standing: R lateropulsion, posterior trunk lean.  Findings Exam findings suggestive of vertigo of central origin and possible BPPV. Specifically, pt demonstrates impaired oculomotor coordination with vertical saccades (overshooting) and horizontal saccades (undershooting) in R eye; impaired VOR cancellation; and limited ocular ROM in R eye. VOR impaired and symptomatic with horizontal and vertical head turns. Modified R Hallpike positive for nystagmus and vertiginous symptoms; however, difficult to visualize direction or torsional component of nystagmus due to pt closing eyes. Pt symptomatic (disequilibrium vs lightheadedness) with supine>sit; however, unable to rule out orthostatic hypotension due to time constraint.  Plan 1. Retest R  Hallpike on 9/23 using Frenzel lenses to remove visual fixation,  increase visibility of nystagmus. Treat BPPV (if present). 2. Adaptation: Expose pt to functional head movement to facilitate nervous system adaptation to loss of vestibular input. Ask pt for baseline dizziness rating (0-10) at beginning of each session and pause/rest until rating is within 2 of baseline rating. VOR (x1 viewing with horizontal and vertical head turning) starting in semi reclined then progressing to seated, then standing.  3. Habituation: if no evidence of orthostatic hypotension with supine>sit, expose pt to noxious stimuli (supine>sit) to decrease symptoms caused by position/movement to decrease symptoms over time/ Goal is production of mild, temporary symptoms (up to 6-7/10 intensity). 4. Compensation: if necessary, may teach/reinforce compensatory strategies to increase pt stability with and tolerance to functional mobility. Examples: visual fixation, use of assistive device. When turning, instruct pt to first move eyes to target, followed by head toward target, then body toward target.  Precautions/Restrictions Restrictions Weight Bearing Restrictions: No General   Vital Signs  Pain Pain Assessment Pain Assessment: No/denies pain FIM:  FIM - Bed/Chair Transfer Bed/Chair Transfer Assistive Devices: Arm rests;HOB elevated;Bed rails Bed/Chair Transfer: 1: Two helpers;4: Supine > Sit: Min A (steadying Pt. > 75%/lift 1 leg);5: Sit > Supine: Supervision (verbal cues/safety issues);4: Bed > Chair or W/C: Min A (steadying Pt. > 75%) (+2A for transfer from w/c>bed) FIM - Locomotion: Wheelchair Distance: 100 Locomotion: Wheelchair: 2: Travels 50 - 149 ft with supervision, cueing or coaxing FIM - Locomotion: Ambulation Locomotion: Ambulation Assistive Devices:  (Handrail in hall) Ambulation/Gait Assistance: 2: Max assist Locomotion: Ambulation: 1: Travels less than 50 ft with maximal assistance (Pt: 25 - 49%) FIM -  Locomotion: Stairs Locomotion: Stairs: 0: Activity did not occur   Hobble, Blair A 07/25/2014, 12:46 PM

## 2014-07-25 NOTE — Significant Event (Signed)
Hypoglycemic Event  CBG: 68  Treatment: 15 GM carbohydrate snack  Symptoms: None  Follow-up CBG: [id number] CBG Result:106  Possible Reasons for Event: Unknown  Comments/MD notified:MD Kitrsteins aware- new order for reduced lantus    Megan Moore, Sylvie Farrier  Remember to initiate Hypoglycemia Order Set & complete

## 2014-07-25 NOTE — Plan of Care (Signed)
Problem: RH BLADDER ELIMINATION Goal: RH STG MANAGE BLADDER WITH ASSISTANCE STG Manage Bladder With min Assistance  Outcome: Not Progressing Patient used pull ups at home. Incontinent bladder

## 2014-07-26 ENCOUNTER — Inpatient Hospital Stay (HOSPITAL_COMMUNITY): Payer: Medicare PPO | Admitting: Occupational Therapy

## 2014-07-26 ENCOUNTER — Inpatient Hospital Stay (HOSPITAL_COMMUNITY): Payer: Medicare PPO | Admitting: Physical Therapy

## 2014-07-26 ENCOUNTER — Inpatient Hospital Stay (HOSPITAL_COMMUNITY): Payer: Medicare PPO | Admitting: Speech Pathology

## 2014-07-26 ENCOUNTER — Inpatient Hospital Stay (HOSPITAL_COMMUNITY): Payer: Medicare PPO

## 2014-07-26 LAB — GLUCOSE, CAPILLARY
GLUCOSE-CAPILLARY: 107 mg/dL — AB (ref 70–99)
GLUCOSE-CAPILLARY: 236 mg/dL — AB (ref 70–99)
GLUCOSE-CAPILLARY: 105 mg/dL — AB (ref 70–99)
Glucose-Capillary: 77 mg/dL (ref 70–99)

## 2014-07-26 MED ORDER — OXYBUTYNIN CHLORIDE 5 MG PO TABS
5.0000 mg | ORAL_TABLET | Freq: Two times a day (BID) | ORAL | Status: DC
  Administered 2014-07-26 – 2014-08-04 (×18): 5 mg via ORAL
  Filled 2014-07-26 (×20): qty 1

## 2014-07-26 NOTE — Progress Notes (Signed)
Occupational Therapy Session Note  Patient Details  Name: Megan Moore MRN: [id number] Date of Birth: Jan 26, 1941  Today's Date: 07/26/2014 OT Individual Time: 9179-1505 OT Individual Time Calculation (min): 45 min    Short Term Goals: Week 1:  OT Short Term Goal 1 (Week 1): Pt will perform LB dressing with Max A in order to increase I with self care OT Short Term Goal 1 - Progress (Week 1): Met OT Short Term Goal 2 (Week 1): Pt will perform shower transfer with Mod A in order to increase I in functional transfers. OT Short Term Goal 2 - Progress (Week 1): Other (comment) (We have not tried shower transfers yet, due to decreased sitting balance.) OT Short Term Goal 3 (Week 1): Pt will perform toilet transfer with Mod A in order to increase I in functional transfers.  OT Short Term Goal 3 - Progress (Week 1): Met OT Short Term Goal 4 (Week 1): Pt will perform toileting with Mod A in order to increase I with self care. OT Short Term Goal 4 - Progress (Week 1): Partly met OT Short Term Goal 5 (Week 1): Pt perform UB dressing with Min A in order to increase I in self care.  OT Short Term Goal 5 - Progress (Week 1): Met Week 2:  OT Short Term Goal 1 (Week 2): Pt will be able to transfer onto a tub bench in the walk in shower with mod A using grab bars and a squat pivot. OT Short Term Goal 2 (Week 2): Pt will be able to stand at the sink with min- mod A (versus mod-max A) to increase Independence with LB self care. OT Short Term Goal 3 (Week 2): Pt will demonstrate improved functional use of RUE to wash L arm with min A. OT Short Term Goal 4 (Week 2): Pt will be able to don pants over R foot and pull pants over her hips while being supported in standing.  Skilled Therapeutic Interventions/Progress Updates:      Pt seen for BADL retraining of bathing and dressing with a focus on use of RUE, dynamic sitting balance, static standing, forward trunk lean in transfers. Pt continues to want to push  back when completing squat pivot transfer and needs mod A to facilitate forward trunk lean. Pt has increased shoulder AROM to 70 degrees abduction. She is using R arm to wash 50% of L arm and thighs. Pt able to don shirt with only 1 cue and no assist. Pt able to come to stand at sink with min A and maintained static standing for 30 sec with only steady A then her R leg felt fatigued. She needed mod A with standing balance when performing LB self care, but was able to find her midline and maintain it for a few seconds at a time. Quick release belt applied to patient's w/c and call light within reach.  Therapy Documentation Precautions:  Precautions Precautions: Fall Precaution Comments: pt pushes toward hemiplegic side Restrictions Weight Bearing Restrictions: No Pain: Pain Assessment Pain Assessment: No/denies pain ADL:  See FIM for current functional status  Therapy/Group: Individual Therapy  [place] 07/26/2014, 8:14 AM

## 2014-07-26 NOTE — Progress Notes (Signed)
Physical Therapy Session Note  Patient Details  Name: Megan Moore MRN: [id number] Date of Birth: 1940-11-06  Today's Date: 07/26/2014 PT Individual Time: 0900-0945 PT Individual Time Calculation (min): 45 min   Short Term Goals: Week 1:  PT Short Term Goal 1 (Week 1): Pt to perform bed mobility with mod Ax1person without use of hospital bed functions PT Short Term Goal 1 - Progress (Week 1): Progressing toward goal PT Short Term Goal 2 (Week 1): Pt to perform bed<>w/c transfers with mod Ax1person PT Short Term Goal 2 - Progress (Week 1): Progressing toward goal PT Short Term Goal 3 (Week 1): Pt to maintain dynamic sitting balance with min Ax1person PT Short Term Goal 3 - Progress (Week 1): Met PT Short Term Goal 4 (Week 1): Pt to propel w/c 75' with min Ax1person PT Short Term Goal 4 - Progress (Week 1): Met PT Short Term Goal 5 (Week 1): Pt to ambulate 10' with LRAD and Ax2persons PT Short Term Goal 5 - Progress (Week 1): Met  Skilled Therapeutic Interventions/Progress Updates:  vestibular eval revealed central and peripheral impairments; see details in note by Billie Ruddy, PT.  W/c propulsion using hemi method on carpet in ADL apt, with min assist for obstacles.  W/c set-up for transfer with cues.  Squat pivot transfer w/c>< actual bed, mod assist with pt assisting with R hand on armrest. Scooting with mod cues for technique.  neuromuscular re-education for wt shifting sit>< stand, and in standing with LUE support, with R Toe Off AFO on, for RLe stance stability via forced use, mirror feedback, manual cues  Gaze exs in sitting; 4 reps eye movements L><R elicited vertigo rated 5/10; relieved quickly when pt closed eyes.  Pt unwilling to continue past that point.   Pt returned to room; all needs in place and family present. Ramp is being built.    Therapy Documentation Precautions:  Precautions Precautions: Fall Precaution Comments: pt pushes toward hemiplegic  side Restrictions Weight Bearing Restrictions: No Pain: Pain Assessment Pain Assessment: No/denies pain   See FIM for current functional status  Therapy/Group: Individual Therapy  Dyson Sevey 07/26/2014, 5:13 PM

## 2014-07-26 NOTE — Progress Notes (Signed)
Physical Therapy Session Note  Patient Details  Name: Megan Moore MRN: [id number] Date of Birth: Mar 25, 1941  Today's Date: 07/26/2014 PT Individual Time: 4098-1191 PT Individual Time Calculation (min): 45 min   Short Term Goals: Week 2:  PT Short Term Goal 1 (Week 2): Pt will perform bed<>chair transfers wtih mod A consistently PT Short Term Goal 2 (Week 2): Pt will perform bed mobility with Min A PT Short Term Goal 3 (Week 2): Pt will ambulate x25' with Ax1 person and LRAD PT Short Term Goal 4 (Week 2): Pt will propell WC x150' with S  Skilled Therapeutic Interventions/Progress Updates:    Pt received seated in w/c; agreeable to therapy. Session focused on reassessment of vestibular symptoms, increasing gait stability. Performed squat pivot transfers from w/c<>bed with mod A. Noted no symptoms of dizziness, disequilibrium, or lightheadedness with supine>sit during this session. With R Dix-Hallpike Test, observed R upbeating torsional nystagmus (unable to determine exact duration of nystagmus due to pt tendency to close eyes) with accompanying vertigo for approximately 10 seconds. Performed R Canalith Repositioning Maneuver (CRM) x2. Instructed pt to remain seated in w/c for 1 hour following treatment, then to resume normal activity/mobility. Pt/family verbalized understanding.  Orthotist present to assess pt need for R AFO. Pt performed gait x10' in controlled environment with R AFO (Toe Off) using L rail with max A, verbal cueing for decreased RLE step length. Orthotist to bring [employer] AFO for pt to trial tomorrow. Departed with pt seated in w/c with quick release belt on for safety, family/friends present in room, and all needs within reach.  Of note, returned to pt room ~3 hours after CRM to reassess vertiginous symptoms using Frenzel goggles. Modified R Dix-Hallpike Test negative and asymptomatic at this time. See vestibular PT evaluation for detailed description of modified test  position. Departed with pt semi reclined in bed with 3 rails up, bed alarm on, and all needs within reach.  Therapy Documentation Precautions:  Precautions Precautions: Fall Precaution Comments: pt pushes toward hemiplegic side Restrictions Weight Bearing Restrictions: No Pain: Pain Assessment Pain Assessment: No/denies pain  See FIM for current functional status  Therapy/Group: Individual Therapy  Arvie Villarruel, Lorenda Ishihara 07/26/2014, 7:32 PM

## 2014-07-26 NOTE — Progress Notes (Signed)
Social Work Patient ID: Megan Moore, female   DOB: 06-Feb-1941, 73 y.o.   MRN: [id number] Met with pt and sister to discuss team conference progression toward goals and discharge 10/2.  Will need family education this coming week with husband and sister. Pt reports the ramp is being completed and will be done by the time pt returns home.  Will discuss with husband and have come in couple of days.  Work on discharge plans.

## 2014-07-26 NOTE — Patient Care Conference (Signed)
Inpatient RehabilitationTeam Conference and Plan of Care Update Date: 07/26/2014   Time: 10;56 AM    Patient Name: Megan Moore      Medical Record Number: [id number]  Date of Birth: 1941-01-19 Sex: Female         Room/Bed: 4W04C/4W04C-01 Payor Info: Payor: [employer] MEDICARE / Plan: [employer] MEDICARE CHOICE PPO / Product Type: *No Product type* /    Admitting Diagnosis: L CVA  Admit Date/Time:  07/14/2014  3:30 PM Admission Comments: No comment available   Primary Diagnosis:  <principal problem not specified> Principal Problem: <principal problem not specified>  Patient Active Problem List   Diagnosis Date Noted  . CVA (cerebral infarction) 07/14/2014    Expected Discharge Date: Expected Discharge Date: 08/04/14  Team Members Present: Physician leading conference: Dr. Claudette Laws Social Worker Present: Dossie Der, LCSW Nurse Present: Carlean Purl, RN PT Present: Edman Circle, PT;Caroline Adriana Simas, PT;Blair Hobble, PT OT Present: Rosalio Loud, OT SLP Present: Jackalyn Lombard, SLP PPS Coordinator present : Tora Duck, RN, CRRN     Current Status/Progress Goal Weekly Team Focus  Medical   RUE strength improving  d/c to home  d/c planning   Bowel/Bladder   Continent bowel, incontinent bladder. Pt. experiences urgency, wore pull up at home PTA  manage b/b with timed toileting, min assist  Continue to encourage timed toileting   Swallow/Nutrition/ Hydration     [facility]        ADL's   set up to min A UB self care, mod to max LB self care. Pt is progressing with standing balance and use of RUE.  supervision UB self care, min A LB self care  ADL retraining, NDT, LUE neuro re-ed, functional mobility, pt/family education   Mobility   Min A bed mobiltiy, Mod/max A transfers, Min A sitting balance, S WC propulsion, +2 gait and stairs  Min A bed mobiltiy and transfers, Min A gait and 6 stairs, S WC x150'  Neuro re-ed; vestibular ex; transfers; gait; activity tolerance   Communication   [facility]        Safety/Cognition/ Behavioral Observations  supervision, returned to baseline   supervision   d/c from ST   Pain   No complaints of pain at this time. Tylenol prn for generalized pain  <2 on a 0-10 scale  assess pain q shift and reassess after each medication intervention   Skin   No skin breaksown at this time  remain free of skin breakdown  assess skin q shift      *See Care Plan and progress notes for long and short-term goals.  Barriers to Discharge: 6 steps    Possible Resolutions to Barriers:  probable need for ramp    Discharge Planning/Teaching Needs:  Home with husband and sister to assist, awar eof the 24 hr care needed.  Husband has observed pt in therapies and aware of the need for a ramp at home.      Team Discussion:  Vestibular eval yesterday and positional vertigo.  DC SP back to baseline functioning.  Husband will need a few days of family education, pt much care. Urgency with bladder timed tolieting.  Will do family education with sister also.  Revisions to Treatment Plan:  None   Continued Need for Acute Rehabilitation Level of Care: The patient requires daily medical management by a physician with specialized training in physical medicine and rehabilitation for the following conditions: Daily direction of a multidisciplinary physical rehabilitation program to ensure safe treatment while eliciting  the highest outcome that is of practical value to the patient.: Yes Daily medical management of patient stability for increased activity during participation in an intensive rehabilitation regime.: Yes Daily analysis of laboratory values and/or radiology reports with any subsequent need for medication adjustment of medical intervention for : Neurological problems;Other  Jaeven Wanzer, Lemar Livings 07/27/2014, 9:05 AM

## 2014-07-26 NOTE — Progress Notes (Signed)
Speech Language Pathology Discharge Summary  Patient Details  Name: Megan Moore MRN: [id number] Date of Birth: [birth year]  Today's Date: 07/26/2014 SLP Individual Time: 5638-7564 SLP Individual Time Calculation (min): 45 min   Skilled Therapeutic Interventions:  Pt was seen for skilled speech therapy targeting cognitive goals.  Pt received in hallway from PT session.  Pt able to recall at least 2 therapeutic activities from previous sessions to explain goals of ST to observer (SLP clinical specialist) with supervision cues.  SLP facilitated the session with a structured task targeting visual scanning to improve attention to right side with pt initially requiring supervision instructional cues which were faded to mod I with improved task familiarity.  SLP set limits to task duration and asked pt to recall established limits to target working memory and self monitoring with pt accurate for recall over 100% of opportunities with supervision cues.  Pt was also 100% accurate for numerical reasoning related to time and money management with supervision question cues.  SLP further facilitated the session with a semi-complex task targeting abstract reasoning, mental flexibility, and thought organization with pt able to complete task for 100% accuracy with supervision cues in a moderately distracting environment.  Upon return to room, pt returned demonstration of use of schedule to facilitate improved recall of daily events and information with mod I.  SLP reviewed and reinforced recommendation that pt have assistance for medication and financial management upon discharge.        Patient has met 4 of 4 long term goals.  Patient to discharge at overall Supervision level.  Reasons goals not met: n/a   Clinical Impression/Discharge Summary:  Pt made functional gains while inpatient and is discharging from speech therapy having met 4 out of 4 long term goals due to improved use of compensatory aids for memory,  problem solving, selective attention, and awareness.  Pt is currently and overall supervision level assist for cognitive tasks which is her baseline, per report.  SLP recommends that pt have assistance for medication and financial management at discharge as pt endorses baseline memory impairments which resulted in her forgetting to take her medications as prescribed.  Pt and family education is complete at this time.  No further ST needs indicated.     Care Partner:  Caregiver Able to Provide Assistance: Yes  Type of Caregiver Assistance: Physical;Cognitive  Recommendation:  None      Equipment: none recommended by SLP    Reasons for discharge: Treatment goals met   Patient/Family Agrees with Progress Made and Goals Achieved: Yes   See FIM for current functional status  Windell Moulding, M.A. CCC-SLP  Raynee Mccasland, Selinda Orion 07/26/2014, 3:59 PM

## 2014-07-26 NOTE — Progress Notes (Signed)
Subjective/Complaints: 73 year old right-handed female with history of hypertension as well as diabetes mellitus with peripheral neuropathy. Presented to [facility] 07/12/2014 with acute onset of right-sided weakness. Cranial CT scan negative. MRI of the brain showed acute infarct left side of the internal capsule as well as remote lacunar infarct of the thalami bilaterally, the left caudate head and posterior right pons. Patient did not receive TPA. Echocardiogram unremarkable. Carotid Dopplers with no ICA stenosis. Neurology services consulted placed on aspirin for CVA prophylaxis. Hospital course urinary tract infection with Cipro initiated 07/12/2014 with end date 07/19/2014  Patient able to use right upper extremity for functional tasks  Review of Systems - Negative except R side weak  Objective: Vital Signs: Blood pressure 139/67, pulse 69, temperature 97.4 F (36.3 C), temperature source Oral, resp. rate 18, weight 60.102 kg (132 lb 8 oz), SpO2 100.00%. No results found. Results for orders placed during the hospital encounter of 07/14/14 (from the past 72 hour(s))  GLUCOSE, CAPILLARY     Status: Abnormal   Collection Time    07/23/14 11:53 AM      Result Value Ref Range   Glucose-Capillary 119 (*) 70 - 99 mg/dL  GLUCOSE, CAPILLARY     Status: Abnormal   Collection Time    07/23/14  4:24 PM      Result Value Ref Range   Glucose-Capillary 202 (*) 70 - 99 mg/dL  GLUCOSE, CAPILLARY     Status: Abnormal   Collection Time    07/23/14  8:48 PM      Result Value Ref Range   Glucose-Capillary 170 (*) 70 - 99 mg/dL   Comment 1 Notify RN    GLUCOSE, CAPILLARY     Status: None   Collection Time    07/24/14  7:38 AM      Result Value Ref Range   Glucose-Capillary 82  70 - 99 mg/dL  GLUCOSE, CAPILLARY     Status: Abnormal   Collection Time    07/24/14 12:20 PM      Result Value Ref Range   Glucose-Capillary 129 (*) 70 - 99 mg/dL  GLUCOSE, CAPILLARY     Status:  Abnormal   Collection Time    07/24/14  4:02 PM      Result Value Ref Range   Glucose-Capillary 146 (*) 70 - 99 mg/dL  GLUCOSE, CAPILLARY     Status: Abnormal   Collection Time    07/24/14  8:52 PM      Result Value Ref Range   Glucose-Capillary 171 (*) 70 - 99 mg/dL  GLUCOSE, CAPILLARY     Status: Abnormal   Collection Time    07/25/14  7:30 AM      Result Value Ref Range   Glucose-Capillary 68 (*) 70 - 99 mg/dL  GLUCOSE, CAPILLARY     Status: Abnormal   Collection Time    07/25/14  8:38 AM      Result Value Ref Range   Glucose-Capillary 106 (*) 70 - 99 mg/dL   Comment 1 Documented in Chart    GLUCOSE, CAPILLARY     Status: Abnormal   Collection Time    07/25/14 11:39 AM      Result Value Ref Range   Glucose-Capillary 165 (*) 70 - 99 mg/dL  GLUCOSE, CAPILLARY     Status: Abnormal   Collection Time    07/25/14  4:36 PM      Result Value Ref Range   Glucose-Capillary 198 (*) 70 - 99 mg/dL  Comment 1 Notify RN    GLUCOSE, CAPILLARY     Status: Abnormal   Collection Time    07/25/14  8:23 PM      Result Value Ref Range   Glucose-Capillary 161 (*) 70 - 99 mg/dL  GLUCOSE, CAPILLARY     Status: None   Collection Time    07/26/14  7:01 AM      Result Value Ref Range   Glucose-Capillary 77  70 - 99 mg/dL   Comment 1 Notify RN       HEENT: normal Cardio: RRR and no murmur Resp: CTA B/L and unlabored GI: BS positive and NT Extremity:  Pulses positive and No Edema Skin:   Intact and Other dry Neuro: Alert/Oriented, Normal Sensory, Abnormal Motor 3- R delt, bi, tri grip, 1/5 RLE except trace knee ext and hip ext Reflexes: 3+ Musc/Skel:mild groin pain with R hip ROM Gen NAD   Assessment/Plan: 1. 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 Physiatrist is providing close team supervision and 24 hour management of active medical  problems listed below. Physiatrist and rehab team continue to assess barriers to discharge/monitor patient progress toward functional and medical goals. FIM: FIM - Bathing Bathing Steps Patient Completed: Chest;Right Arm;Abdomen;Front perineal area;Buttocks;Right upper leg;Left upper leg;Left lower leg (including foot) Bathing: 4: Min-Patient completes 8-9 108f 10 parts or 75+ percent  FIM - Upper Body Dressing/Undressing Upper body dressing/undressing steps patient completed: Put head through opening of pull over shirt/dress;Thread/unthread left sleeve of pullover shirt/dress;Pull shirt over trunk;Thread/unthread right sleeve of pullover shirt/dresss Upper body dressing/undressing: 5: Supervision: Safety issues/verbal cues FIM - Lower Body Dressing/Undressing Lower body dressing/undressing steps patient completed: Thread/unthread left underwear leg;Thread/unthread left pants leg;Pull underwear up/down Lower body dressing/undressing: 2: Max-Patient completed 25-49% of tasks  FIM - Toileting Toileting steps completed by patient: Performs perineal hygiene Toileting: 2: Max-Patient completed 1 of 3 steps  FIM - Diplomatic Services operational officer Devices: Grab bars Toilet Transfers: 2-From toilet/BSC: Max A (lift and lower assist);2-To toilet/BSC: Max A (lift and lower assist)  FIM - Press photographer Assistive Devices: Arm rests;HOB elevated;Bed rails Bed/Chair Transfer: 3: Supine > Sit: Mod A (lifting assist/Pt. 50-74%/lift 2 legs;3: Bed > Chair or W/C: Mod A (lift or lower assist)  FIM - Locomotion: Wheelchair Distance: 100 Locomotion: Wheelchair: 2: Travels 50 - 149 ft with supervision, cueing or coaxing FIM - Locomotion: Ambulation Locomotion: Ambulation Assistive Devices:  (Handrail in hall) Ambulation/Gait Assistance: 2: Max assist Locomotion: Ambulation: 1: Travels less than 50 ft with maximal assistance (Pt: 25 - 49%)  Comprehension Comprehension  Mode: Auditory Comprehension: 5-Understands complex 90% of the time/Cues < 10% of the time  Expression Expression Mode: Verbal Expression: 5-Expresses basic needs/ideas: With no assist  Social Interaction Social Interaction: 5-Interacts appropriately 90% of the time - Needs monitoring or encouragement for participation or interaction.  Problem Solving Problem Solving: 3-Solves basic 50 - 74% of the time/requires cueing 25 - 49% of the time  Memory Memory: 3-Recognizes or recalls 50 - 74% of the time/requires cueing 25 - 49% of the time   1. Functional deficits secondary to left CVA with right-sided weakness  2. DVT Prophylaxis/Anticoagulation: SCDs. Monitor for any signs of DVT  3. Pain Management: Tylenol as needed , R groin pain likely from hip, 9/19 R hip Xray,showing mod OA 4. Hypertension. Amlodipine 10 mg daily,  lisinopril 40 mg daily, metoprolol 50 mg daily, hydrochlorothiazide 12.5 mg daily.BP on low side, elevated BUN will hold HCTZ Monitor with increased mobility  5. Neuropsych: This patient is capable of making decisions on her own behalf.  6. Skin/Wound Care: Routine skin checks  7. Diabetes mellitus with peripheral neuropathy. Lantus insulin 25 units daily, will reduce slightly secondary to low am CBG. Check blood sugars a.c. and at bedtime  8. Hyperlipidemia. Atorvastatin 80 mg daily  9. Urinary tract infection. Cipro 500 mg twice a day completed, monitor for recurrence of symptoms. Continues to have urinary frequency oxybutynin 5 mg one half tablet twice a day for history of urinary incontinence, increased 1 tab LOS (Days) 12 A FACE TO FACE EVALUATION WAS PERFORMED    KIRSTEINS,ANDREW E 07/26/2014, 9:09 AM

## 2014-07-27 ENCOUNTER — Inpatient Hospital Stay (HOSPITAL_COMMUNITY): Payer: Medicare PPO | Admitting: Occupational Therapy

## 2014-07-27 ENCOUNTER — Inpatient Hospital Stay (HOSPITAL_COMMUNITY): Payer: Medicare PPO

## 2014-07-27 LAB — GLUCOSE, CAPILLARY
GLUCOSE-CAPILLARY: 69 mg/dL — AB (ref 70–99)
Glucose-Capillary: 102 mg/dL — ABNORMAL HIGH (ref 70–99)
Glucose-Capillary: 118 mg/dL — ABNORMAL HIGH (ref 70–99)
Glucose-Capillary: 135 mg/dL — ABNORMAL HIGH (ref 70–99)
Glucose-Capillary: 161 mg/dL — ABNORMAL HIGH (ref 70–99)

## 2014-07-27 NOTE — Progress Notes (Signed)
Physical Therapy Session Note  Patient Details  Name: Megan Moore MRN: [id number] Date of Birth: 1941-01-31  Today's Date: 07/27/2014 PT Individual Time: 0900-1030; 1415- 1503 PT Individual Time Calculation (min): 90 min ; 48 min  Short Term Goals: Week 2:  PT Short Term Goal 1 (Week 2): Pt will perform bed<>chair transfers wtih mod A consistently PT Short Term Goal 2 (Week 2): Pt will perform bed mobility with Min A PT Short Term Goal 3 (Week 2): Pt will ambulate x25' with Ax1 person and LRAD PT Short Term Goal 4 (Week 2): Pt will propell WC x150' with S  Skilled Therapeutic Interventions/Progress Updates:   Tx 1:  wearing R Toe Off AFO; Scarlette Slice to deliver [employer] .  Pt had not eaten breakfast; pt and NT informed she would be seen for 90 minutes, but pt did not want to eat beforehand.  neuromuscular re-education via demo, VCs , manual cues for RLE stance stability ; Standing without UE support to prevent pushing, wt shifting R><L progressing to R knee flex/ext Gait usng L rail in hallway, x 30' with mod/max assist  Kinetron x 7 min at level 4, rated 13 on BORg using bil LEs and LUE focusing on R hip stabilty.  Pt unable to control IR/ER during flex or ext Static and dynamic standing in stander, working on midline orientation bil terminal hip ext in stander, without UEs on table for increased forced use of RLE Gaze stabilization exs in standing, looking at "A" for horozontal and vertical head turns, x 4 x 10 slow then fast.  Vertigo elicited only with fast movements, rated 4-5/10, lowering to 2/10 in less than 30 seconds R hand gross grasp to pick up Rx bottles, with L hand to assist  PT requested pt participate in further vestibular assessment (Hallpike to look for nystagmus).  Pt declined, stating she had LBP and L UE pain due to that yesterday.   W/c brake management with each approp hand, with VCs and tactile cues to use R hand.  legrest management with assistance, but  pt is able to move bil LEs off legrests in preparation for transfer.  R legrest replaced for better function when pt propels with LUE and LLE  Bobath technique for w/c>< Kinetron squat pivot transfer with mod assist.  Pt requires cues throughout transfer for sequencing, judgement of correct position of R foot, etc.  Sit>< stand repeatedly with max assist, without use of bil LEs.  Pt exhausted by end of session.  Pt propelled x 50' using hemi method, supervision; limited by fatigue. Returned to room, and set up for breakfast.  tx 2:  +2 for skilled tx for neuro re-ed  Pt prepped w/c for recliner with mod cues.    Pt received [place] AFO.  Gait in hallway x 30' x 2 with new AFO, L hand on rail; min> max assist.  Focus on placement of R foot, and R stance stability.  Sit>< stand repeatedly with Bobath method, max> mod assist.  Step/taps L foot onto 4" high step, bil UE support on railings, +2 for R knee control, upright trunk.  Tall kneeling on mat working on midline orientation, R hip activation.    Therapy Documentation Precautions:  Precautions Precautions: Fall Precaution Comments: pt pushes toward hemiplegic side Restrictions Weight Bearing Restrictions: No   Pain: Pain Assessment Pain Assessment: No/denies pain      See FIM for current functional status  Therapy/Group: Individual Therapy  Leialoha Hanna 07/27/2014,  10:43 AM

## 2014-07-27 NOTE — Discharge Planning (Signed)
Diabetic education discussed with patient and husband. Discussed diet, meal planning,insulin administartion. Husband advised to bring Lantus pen in to ad mister medicine

## 2014-07-27 NOTE — Progress Notes (Signed)
Occupational Therapy Session Note  Patient Details  Name: Megan Moore MRN: [id number] Date of Birth: [birth year]  Today's Date: 07/27/2014 OT Individual Time: 5638-7564 OT Individual Time Calculation (min): 60 min    Short Term Goals: Week 1:  OT Short Term Goal 1 (Week 1): Pt will perform LB dressing with Max A in order to increase I with self care OT Short Term Goal 1 - Progress (Week 1): Met OT Short Term Goal 2 (Week 1): Pt will perform shower transfer with Mod A in order to increase I in functional transfers. OT Short Term Goal 2 - Progress (Week 1): Other (comment) (We have not tried shower transfers yet, due to decreased sitting balance.) OT Short Term Goal 3 (Week 1): Pt will perform toilet transfer with Mod A in order to increase I in functional transfers.  OT Short Term Goal 3 - Progress (Week 1): Met OT Short Term Goal 4 (Week 1): Pt will perform toileting with Mod A in order to increase I with self care. OT Short Term Goal 4 - Progress (Week 1): Partly met OT Short Term Goal 5 (Week 1): Pt perform UB dressing with Min A in order to increase I in self care.  OT Short Term Goal 5 - Progress (Week 1): Met Week 2:  OT Short Term Goal 1 (Week 2): Pt will be able to transfer onto a tub bench in the walk in shower with mod A using grab bars and a squat pivot. OT Short Term Goal 2 (Week 2): Pt will be able to stand at the sink with min- mod A (versus mod-max A) to increase Independence with LB self care. OT Short Term Goal 3 (Week 2): Pt will demonstrate improved functional use of RUE to wash L arm with min A. OT Short Term Goal 4 (Week 2): Pt will be able to don pants over R foot and pull pants over her hips while being supported in standing.  Skilled Therapeutic Interventions/Progress Updates:      Pt seen for BADL retraining of toileting, bathing, and dressing with a focus on transfers, postural control, use of RUE. Pt worked on bed mobility to transfer to R side and push up with R  arm with 50 % support. Pt did not complain of dizziness or arm pain. Pt then transferred to w/c with mod A but needed max cues as she pushed back. She has great difficulty remembering to lean forward during the transfer. Pt also worked on toilet and tub bench transfers using grab bar with mod A. Pt demonstrated improved sitting balance on tub bench with close S and min A as she leaned to R to wash buttocks with L hand.  Pt transferred back to chair to complete dressing. Much improved standing balance as she pulled pants over hips with cues to find her midline posture and support varying from min to steady A. Pt actively used R hand to don shirt and was able to wash L arm with HOH mod A.  [name] self care completed and PT arrived for her next session.     Therapy Documentation Precautions:  Precautions Precautions: Fall Precaution Comments: pt pushes toward hemiplegic side Restrictions Weight Bearing Restrictions: No   Pain: Pain Assessment Pain Assessment: No/denies pain  ADL: See FIM for current functional status  Therapy/Group: Individual Therapy  SAGUIER,JULIA 07/27/2014, 10:42 AM

## 2014-07-27 NOTE — Progress Notes (Signed)
Hypoglycemic Event  CBG: 69 Treatment: 15 GM carbohydrate snack  Symptoms: None  Follow-up CBG: Time 0725 :CBG Result:102  Possible Reasons for Event: Inadequate meal intake  Comments/MD notified:Dr. Teressa Lower, Jamesetta [place]  Remember to initiate Hypoglycemia Order Set & completeAdult (Non-Pregnant) Hypoglycemia Protocol Treatment Guidelines  1.  RN shall initiate Hypoglycemia Protocol emergency measures immediately when:            w Routine or STAT CBG and/or a lab glucose indicates hypoglycemia (CBG < 70 mg/dl)  2.  Treat the patient according to ability to take PO's and severity of hypoglycemia.   3.  If patient is on GlucoStabilizer, follow directions provided by the [facility] for hypoglycemic events.  4.  If patient on insulin pump, follow Hypoglycemia Protocol.  If patient requires more than one treatment have patient place pump in SUSPEND and notify MD.  DO NOT leave pump in SUSPEND for greater than 30 minutes unless ordered by MD.  A.  Treatment for Mild or Moderate-Patient cooperative and able to swallow    1.  Patient taking PO's and can cooperate   a.  Give one of the following 15 gram CHO options:                           w 1 tube oral dextrose gel                           w 3-4 Glucose tablets                           w 4 oz. Juice                           w 4 oz. regular soda                                    ESRD patients:  clear, regular soda                           w 8 oz. skim milk    b.  Recheck CBG in 15 minutes after treatment                            w If CBG < 70 mg/dl, repeat treatment and recheck until hypoglycemia is resolved                            w If CBG > 70 mg/dl and next meal is more than 1 hour away, give additional 15 grams CHO   2.  Patient NPO-Patient cooperative and no altered mental status    a.  Give 25 ml of D50 IV.   b.  Recheck CBG in 15 minutes after treatment.                             w  If CBG is less than 70 mg/dl, repeat treatment and recheck until hypoglycemia is resolved.   c.  Notify MD for further orders.  SPECIAL CONSIDERATIONS:    a.  If no IV access,                              w Start IV of D5W at [facility]                             w Give 25 ml of D50 IV.    b.  If unable to gain IV access                             w Give Glucagon IM:    i.  1 mg if patient weighs more than 45.5 kg    ii.  0.5 mg if patient weighs less than 45.5 kg   c.  Notify MD for further orders  B.  Treatment for Severe-- Patient unconscious or unable to take PO's safely    1.  Position patient on side   2.  Give 50 ml D50 IV   3.  Recheck CBG in 15 minutes.                    w If CBG is less than 70 mg/dl, repeat treatment and recheck until hypoglycemia is resolved.   4.  Notify MD for further orders.    SPECIAL CONSIDERATIONS:    a.  If no IV access                              w Give Glucagon IM                                        i.  1 mg if patient weighs more than 45.5 kg                                       ii.  0.5 mg if patient weighs less than 45.5 kg                              w Start IV of D5W at 50 ml/hr and give 50 ml D50 IV   b.  If no IV access and active seizure                               w Call Rapid Response   c.  If unable to gain IV access, give Glucagon IM:                              w 1 mg if patient weighs more than 45.5 kg                              w 0.5 mg if patient weighs less than 45.5 kg   d.  Notify MD for further orders.  C.  Complete smart text progress note to document intervention and follow-up CBG   1.  In  [facility] patient chart, click on Notes (left side of screen)   2.  Create Progress Note   3.  Click on [employer].  In the Match box type "hypo" and enter    4.  Double click on [facility] IP HYPOGLYCEMIC EVENT and enter data   5.  MD must be notified if patient is NPO or experienced severe hypoglycemia

## 2014-07-27 NOTE — Plan of Care (Signed)
Problem: RH BLADDER ELIMINATION Goal: RH STG MANAGE BLADDER WITH ASSISTANCE STG Manage Bladder With min Assistance  Outcome: Progressing Tends to be incont. At night when sleeping.changed by staff.

## 2014-07-28 ENCOUNTER — Inpatient Hospital Stay (HOSPITAL_COMMUNITY): Payer: Medicare PPO | Admitting: Occupational Therapy

## 2014-07-28 ENCOUNTER — Inpatient Hospital Stay (HOSPITAL_COMMUNITY): Payer: Medicare PPO | Admitting: Physical Therapy

## 2014-07-28 ENCOUNTER — Inpatient Hospital Stay (HOSPITAL_COMMUNITY): Payer: Medicare PPO | Admitting: *Deleted

## 2014-07-28 LAB — GLUCOSE, CAPILLARY
GLUCOSE-CAPILLARY: 79 mg/dL (ref 70–99)
Glucose-Capillary: 80 mg/dL (ref 70–99)
Glucose-Capillary: 276 mg/dL — ABNORMAL HIGH (ref 70–99)
Glucose-Capillary: 128 mg/dL — ABNORMAL HIGH (ref 70–99)

## 2014-07-28 MED ORDER — GABAPENTIN 300 MG PO CAPS
300.0000 mg | ORAL_CAPSULE | Freq: Every day | ORAL | Status: DC
Start: 2014-07-28 — End: 2014-08-04
  Administered 2014-07-28 – 2014-08-03 (×7): 300 mg via ORAL
  Filled 2014-07-28 (×8): qty 1

## 2014-07-28 NOTE — Progress Notes (Signed)
Physical Therapy Session Note  Patient Details  Name: Megan Moore MRN: [id number] Date of Birth: 12-30-1940  Today's Date: 07/28/2014 PT Individual Time: 1130-1201 PT Individual Time Calculation (min): 31 min   Skilled Therapeutic Interventions/Progress Updates:    Pt received seated in w/c with quick release belt on; agreeable to therapy. Session focused on increasing activity tolerance with focus on self-propulsion of w/c. Pt performed w/c mobility x150', 1x130' in controlled environment, x25 in home environment (rest breaks between each trial) with supervision, cueing for technique to negotiate door sills and max encouragement to continue propulsion beyond 100'. In rehab apartment, pt negotiated obstacles with 25% cueing for attention to R side. Performed squat pivot transfers from w/c<>bed with mod A, 50% cueing for setup and hand placement. Session ended in pt room, where pt was left seated in w/c with quick release belt in place for safety and all needs within reach.  Therapy Documentation Precautions:  Precautions Precautions: Fall Precaution Comments: pt pushes toward hemiplegic side Restrictions Weight Bearing Restrictions: No Pain: Pain Assessment Pain Assessment: No/denies pain Pain Score: 2  Locomotion : Wheelchair Mobility Distance: 1x150', 1x130'   See FIM for current functional status  Therapy/Group: Individual Therapy  Hobble, Lorenda Ishihara 07/28/2014, 12:24 PM

## 2014-07-28 NOTE — Progress Notes (Signed)
Physical Therapy Weekly Progress Note  Patient Details  Name: Megan Moore MRN: [id number] Date of Birth: 02-11-1941  Beginning of progress report period: July 21, 2014 End of progress report period: July 28, 2014  Today's Date: 07/28/2014 PT Individual Time: 10:05-11:05 ( ) and 13:00-13:30 ( )   Patient has met 5 of 5 short term goals. Pt is making slow, but steady progress with motor control, postural control, grading movements, sitting balance, and activity tolerance.  Patient continues to demonstrate the following deficits: R quad and glute weakness, recall of new information, and safety awareness and therefore will continue to benefit from skilled PT intervention to enhance overall performance with activity tolerance, balance, postural control, functional use of  right upper extremity and right lower extremity, attention, awareness and coordination.  Patient progressing toward long term goals..  Continue plan of care.  PT Short Term Goals Week 2:  PT Short Term Goal 1 (Week 2): Pt will perform bed<>chair transfers wtih mod A consistently PT Short Term Goal 1 - Progress (Week 2): Met PT Short Term Goal 2 (Week 2): Pt will perform bed mobility with Min A PT Short Term Goal 2 - Progress (Week 2): Met PT Short Term Goal 3 (Week 2): Pt will ambulate x25' with Ax1 person and LRAD PT Short Term Goal 3 - Progress (Week 2): Met PT Short Term Goal 4 (Week 2): Pt will propell WC x150' with S PT Short Term Goal 4 - Progress (Week 2): Met Week 3:  PT Short Term Goal 1 (Week 3): STG=LTG due to LOS  Skilled Therapeutic Interventions/Progress Updates:  First tx focused on transfer training, gait, stairs, and NMR via forced use, manual facilitation, and multi-modal cues.  Received in gym from OT. Vestibular sx's seem to be improved, although pt unable to report change.   Therapeutic activity Encouraged pt to increase independence and direct self-cares and problem solving with  squat-pivot transfers. Cue card provided for sequence reminder. Pt able to read card aloud, but unable to functionally incorporate.  Performed squat-pivots x4 throughout with manual facilitation and Mod A to safely complete turn. Pt had one instance of lurching self into chair without helper being ready, sitting on wheel with unsafe technique. Pt seemed unconcerned about safety issue of this.  Performed sit<>stands with therapist in front, blokcing R knee, focusing on anterior translation.  NMR performed in standing for increased R-sided weight bearing. Therapist provided max A on R-side for stability and weight shifting during:  - Step taps to colored discs for increased cognitive challenge.  - Static standing and weight shifts - lateral scooting along mat  Gait training performed trialing hemi walker and RW with hand orthosis, but neither of these were very successful at end of tx with max A for weight shifting and RLE management. Will likely continue at hall rail.    Second tx focused on gait training and NMR:  - x25' with Max A and hall rail and [place] AFO - max facilitation for hip ext and R knee ext in stance. Pt able to advance RLE 100% of the time.  - x10' with hemi-walker but pt continues to reach out for wall and place hemi-walker erratically. Pt wanting to stop, but states "Can I take this home? I can work on it more there." Pt educated on need to improve functional performance in this setting to translate to safe use at home.   NMR performed via forced use R-sided weight shifting and manual facilitation performed during gait at  quat and hip ext. During seated rest, pt instructed in seated NMR for LAQ and marching x10 each with tactile stimulation in limited range due to weakness.      Therapy Documentation Precautions:  Precautions Precautions: Fall Precaution Comments: pt pushes toward hemiplegic side Restrictions Weight Bearing Restrictions: No    Pain: Pain  Assessment Pain Assessment: No/denies pain Pain Score: 2     Locomotion : Wheelchair Mobility Distance: 1x150', 1x130'   See FIM for current functional status  Therapy/Group: Individual Therapy and Co-Treatment with Clinical specialist PT Kennieth Rad, PT, DPT  07/28/2014, 12:51 PM

## 2014-07-28 NOTE — Progress Notes (Signed)
Occupational Therapy Session Note  Patient Details  Name: Megan Moore MRN: [id number] Date of Birth: [birth year]  Today's Date: 07/28/2014 OT Individual Time: 0900-1005 OT Individual Time Calculation (min): 65 min    Short Term Goals: Week 1:  OT Short Term Goal 1 (Week 1): Pt will perform LB dressing with Max A in order to increase I with self care OT Short Term Goal 1 - Progress (Week 1): Met OT Short Term Goal 2 (Week 1): Pt will perform shower transfer with Mod A in order to increase I in functional transfers. OT Short Term Goal 2 - Progress (Week 1): Other (comment) (We have not tried shower transfers yet, due to decreased sitting balance.) OT Short Term Goal 3 (Week 1): Pt will perform toilet transfer with Mod A in order to increase I in functional transfers.  OT Short Term Goal 3 - Progress (Week 1): Met OT Short Term Goal 4 (Week 1): Pt will perform toileting with Mod A in order to increase I with self care. OT Short Term Goal 4 - Progress (Week 1): Partly met OT Short Term Goal 5 (Week 1): Pt perform UB dressing with Min A in order to increase I in self care.  OT Short Term Goal 5 - Progress (Week 1): Met Week 2:  OT Short Term Goal 1 (Week 2): Pt will be able to transfer onto a tub bench in the walk in shower with mod A using grab bars and a squat pivot. OT Short Term Goal 2 (Week 2): Pt will be able to stand at the sink with min- mod A (versus mod-max A) to increase Independence with LB self care. OT Short Term Goal 3 (Week 2): Pt will demonstrate improved functional use of RUE to wash L arm with min A. OT Short Term Goal 4 (Week 2): Pt will be able to don pants over R foot and pull pants over her hips while being supported in standing.  Skilled Therapeutic Interventions/Progress Updates:    Pt seen for BADL retraining of dressing with a focus on postural control during transfers, sit to stand, and standing to avoid a posterior lean which leads to an unsafe transfer.  Pt needed  max cues and max A to stay in a forward lean to complete a smooth squat pivot bed to w/c with max A. Pt then dressed from w/c needing mod A in standing as she pulled pants over hips. Pt was then able to stand at sink for 30 seconds with just steady A with L hand support on sink. Pt taken to gym to work on RUE neuro re-ed, postural control with reaching and transfers. Pt transferred to mat to L side with mod to max A support. On flat mat, she worked on scooting with forward lean and hip shift 5x to R and L, 2 sets with min to mod support once pt was able to achieve forward lean. A towel was wedged under R hip to improve pelvic alignment. Pt worked on rolling a small bolster placed on a foot stool back and forth actively using R hand to increase forward lean with back extension and R A/AROM with elbow flexion to bring hand to chin and with reaching for and grasping cones with trunk rotation. Pt tolerated the therapy well. Her PT arrived for the next session.  Therapy Documentation Precautions:  Precautions Precautions: Fall Precaution Comments: pt pushes toward hemiplegic side Restrictions Weight Bearing Restrictions: No   Pain: Pain Assessment Pain Assessment: 0-10 Pain  Score: 2  Pain Type: Acute pain Pain Location: Knee Pain Orientation: Right Pain Radiating Towards: Towards the foot Pain Descriptors / Indicators: Aching Pain Frequency: Intermittent Pain Onset: Sudden Pain Intervention(s): Medication (See eMAR) ADL:  See FIM for current functional status  Therapy/Group: Individual Therapy  [place] 07/28/2014, 11:23 AM

## 2014-07-28 NOTE — Progress Notes (Signed)
Subjective/Complaints: 73 year old right-handed female with history of hypertension as well as diabetes mellitus with peripheral neuropathy. Presented to [facility] 07/12/2014 with acute onset of right-sided weakness. Cranial CT scan negative. MRI of the brain showed acute infarct left side of the internal capsule as well as remote lacunar infarct of the thalami bilaterally, the left caudate head and posterior right pons. Patient did not receive TPA. Echocardiogram unremarkable. Carotid Dopplers with no ICA stenosis. Neurology services consulted placed on aspirin for CVA prophylaxis.   Bilateral foot pain early this am , no pain during therapy, no foot trauma  Review of Systems - Negative except R side weak  Objective: Vital Signs: Blood pressure 127/64, pulse 88, temperature 98.8 F (37.1 C), temperature source Oral, resp. rate 18, weight 60.102 kg (132 lb 8 oz), SpO2 100.00%. No results found. Results for orders placed during the hospital encounter of 07/14/14 (from the past 72 hour(s))  GLUCOSE, CAPILLARY     Status: Abnormal   Collection Time    07/25/14  8:38 AM      Result Value Ref Range   Glucose-Capillary 106 (*) 70 - 99 mg/dL   Comment 1 Documented in Chart    GLUCOSE, CAPILLARY     Status: Abnormal   Collection Time    07/25/14 11:39 AM      Result Value Ref Range   Glucose-Capillary 165 (*) 70 - 99 mg/dL  GLUCOSE, CAPILLARY     Status: Abnormal   Collection Time    07/25/14  4:36 PM      Result Value Ref Range   Glucose-Capillary 198 (*) 70 - 99 mg/dL   Comment 1 Notify RN    GLUCOSE, CAPILLARY     Status: Abnormal   Collection Time    07/25/14  8:23 PM      Result Value Ref Range   Glucose-Capillary 161 (*) 70 - 99 mg/dL  GLUCOSE, CAPILLARY     Status: None   Collection Time    07/26/14  7:01 AM      Result Value Ref Range   Glucose-Capillary 77  70 - 99 mg/dL   Comment 1 Notify RN    GLUCOSE, CAPILLARY     Status: Abnormal   Collection  Time    07/26/14 11:42 AM      Result Value Ref Range   Glucose-Capillary 107 (*) 70 - 99 mg/dL   Comment 1 Notify RN    GLUCOSE, CAPILLARY     Status: Abnormal   Collection Time    07/26/14  4:29 PM      Result Value Ref Range   Glucose-Capillary 236 (*) 70 - 99 mg/dL  GLUCOSE, CAPILLARY     Status: Abnormal   Collection Time    07/26/14  8:51 PM      Result Value Ref Range   Glucose-Capillary 105 (*) 70 - 99 mg/dL  GLUCOSE, CAPILLARY     Status: Abnormal   Collection Time    07/27/14  6:48 AM      Result Value Ref Range   Glucose-Capillary 69 (*) 70 - 99 mg/dL  GLUCOSE, CAPILLARY     Status: Abnormal   Collection Time    07/27/14  7:22 AM      Result Value Ref Range   Glucose-Capillary 102 (*) 70 - 99 mg/dL   Comment 1 Notify RN    GLUCOSE, CAPILLARY     Status: Abnormal   Collection Time    07/27/14  1:17 PM  Result Value Ref Range   Glucose-Capillary 118 (*) 70 - 99 mg/dL  GLUCOSE, CAPILLARY     Status: Abnormal   Collection Time    07/27/14  4:25 PM      Result Value Ref Range   Glucose-Capillary 135 (*) 70 - 99 mg/dL   Comment 1 Notify RN    GLUCOSE, CAPILLARY     Status: Abnormal   Collection Time    07/27/14  9:17 PM      Result Value Ref Range   Glucose-Capillary 161 (*) 70 - 99 mg/dL  GLUCOSE, CAPILLARY     Status: None   Collection Time    07/28/14  6:48 AM      Result Value Ref Range   Glucose-Capillary 80  70 - 99 mg/dL     HEENT: normal Cardio: RRR and no murmur Resp: CTA B/L and unlabored GI: BS positive and NT Extremity:  Pulses positive and No Edema Skin:   Intact and Other dry Neuro: Alert/Oriented, Normal Sensory, Abnormal Motor 3- R delt, bi, tri grip, 1/5 RLE except trace knee ext and hip ext Reflexes: 3+ Musc/Skel:mild groin pain with R hip ROM Gen NAD   Assessment/Plan: 1. 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 Physiatrist is providing close team supervision and 24 hour management of active medical problems listed below. Physiatrist and rehab team continue to assess barriers to discharge/monitor patient progress toward functional and medical goals. FIM: FIM - Bathing Bathing Steps Patient Completed: Chest;Right Arm;Abdomen;Front perineal area;Right upper leg;Left upper leg;Left lower leg (including foot) Bathing: 3: Mod-Patient completes 5-7 15f 10 parts or 50-74%  FIM - Upper Body Dressing/Undressing Upper body dressing/undressing steps patient completed: Put head through opening of pull over shirt/dress;Thread/unthread left sleeve of pullover shirt/dress;Pull shirt over trunk;Thread/unthread right sleeve of pullover shirt/dresss Upper body dressing/undressing: 5: Supervision: Safety issues/verbal cues FIM - Lower Body Dressing/Undressing Lower body dressing/undressing steps patient completed: Thread/unthread left underwear leg;Thread/unthread left pants leg;Pull underwear up/down;Pull pants up/down;Don/Doff left shoe Lower body dressing/undressing: 3: Mod-Patient completed 50-74% of tasks  FIM - Toileting Toileting steps completed by patient: Performs perineal hygiene Toileting: 2: Max-Patient completed 1 of 3 steps  FIM - Diplomatic Services operational officer Devices: Grab bars Toilet Transfers: 3-From toilet/BSC: Mod A (lift or lower assist);3-To toilet/BSC: Mod A (lift or lower assist)  FIM - Press photographer Assistive Devices: Arm rests Bed/Chair Transfer: 3: Bed > Chair or W/C: Mod A (lift or lower assist);3: Chair or W/C > Bed: Mod A (lift or lower assist)  FIM - Locomotion: Wheelchair Distance: 100 Locomotion: Wheelchair: 0: Activity did not occur FIM - Locomotion: Ambulation Locomotion: Ambulation Assistive Devices: Other (comment);Orthosis (L railing in hallway) Ambulation/Gait Assistance: 2: Max assist Locomotion:  Ambulation: 1: Travels less than 50 ft with maximal assistance (Pt: 25 - 49%)  Comprehension Comprehension Mode: Auditory Comprehension: 5-Understands complex 90% of the time/Cues < 10% of the time  Expression Expression Mode: Verbal Expression: 5-Expresses basic needs/ideas: With no assist  Social Interaction Social Interaction: 5-Interacts appropriately 90% of the time - Needs monitoring or encouragement for participation or interaction.  Problem Solving Problem Solving: 5-Solves basic 90% of the time/requires cueing < 10% of the time  Memory Memory: 6-Assistive device: No helper   1. Functional deficits secondary to left CVA with right-sided weakness  2. DVT Prophylaxis/Anticoagulation: SCDs. Monitor for any signs  of DVT  3. Pain Management: Tylenol as needed , R groin pain likely from hip, 9/19 R hip Xray,showing mod OA 4. Hypertension. Amlodipine 10 mg daily, lisinopril 40 mg daily, metoprolol 50 mg daily, hydrochlorothiazide 12.5 mg daily.BP on low side, elevated BUN will hold HCTZ Monitor with increased mobility  5. Neuropsych: This patient is capable of making decisions on her own behalf.  6. Skin/Wound Care: Routine skin checks  7. Diabetes mellitus with peripheral neuropathy, had bilateral foot pain early this am, trial gabapentin qhs. Lantus insulin 25 units daily, will reduce slightly secondary to low am CBG. Check blood sugars a.c. and at bedtime  8. Hyperlipidemia. Atorvastatin 80 mg daily  9. Urinary tract infection. Cipro 500 mg twice a day completed, monitor for recurrence of symptoms. Continues to have urinary frequency oxybutynin 5 mg one half tablet twice a day for history of urinary incontinence, increased 1 tab LOS (Days) 14 A FACE TO FACE EVALUATION WAS PERFORMED    Megan Moore E 07/28/2014, 7:51 AM

## 2014-07-29 ENCOUNTER — Inpatient Hospital Stay (HOSPITAL_COMMUNITY): Payer: Medicare PPO | Admitting: Physical Therapy

## 2014-07-29 DIAGNOSIS — I6350 Cerebral infarction due to unspecified occlusion or stenosis of unspecified cerebral artery: Secondary | ICD-10-CM

## 2014-07-29 DIAGNOSIS — R5381 Other malaise: Secondary | ICD-10-CM

## 2014-07-29 LAB — GLUCOSE, CAPILLARY
GLUCOSE-CAPILLARY: 82 mg/dL (ref 70–99)
Glucose-Capillary: 176 mg/dL — ABNORMAL HIGH (ref 70–99)
Glucose-Capillary: 170 mg/dL — ABNORMAL HIGH (ref 70–99)
Glucose-Capillary: 120 mg/dL — ABNORMAL HIGH (ref 70–99)

## 2014-07-29 NOTE — Progress Notes (Signed)
Patient ID: Megan Moore, female   DOB: 1941/06/02, 73 y.o.   MRN: [id number]  07/29/14.  Subjective/Complaints:  73 year old right-handed female with history of hypertension as well as diabetes mellitus with peripheral neuropathy.  Presented to [facility] 07/12/2014 with acute onset of right-sided weakness. Cranial CT scan negative. MRI of the brain showed acute infarct left side of the internal capsule as well as remote lacunar infarct of the thalami bilaterally, the left caudate head and posterior right pons. Patient did not receive TPA. Echocardiogram unremarkable. Carotid Dopplers with no ICA stenosis.  Neurology services consulted placed on aspirin for CVA prophylaxis.   No past medical history on file.  Patient Vitals for the past 24 hrs:  BP Temp Temp src Pulse Resp SpO2  07/29/14 0551 110/64 mmHg 98.2 F (36.8 C) Oral 70 18 100 %  07/28/14 1959 130/51 mmHg 98.1 F (36.7 C) Oral 67 18 100 %  07/28/14 1300 106/53 mmHg 97.8 F (36.6 C) Oral 60 18 100 %     Intake/Output Summary (Last 24 hours) at 07/29/14 0857 Last data filed at 07/28/14 1800  Gross per 24 hour  Intake    520 ml  Output      0 ml  Net    520 ml    No results found for this basename: HGBA1C   CBG (last 3)   Recent Labs  07/28/14 1653 07/28/14 2030 07/29/14 0644  GLUCAP 276* 128* 82     Review of Systems - Negative except R side weak  Objective: Vital Signs: Blood pressure 110/64, pulse 70, temperature 98.2 F (36.8 C), temperature source Oral, resp. rate 18, weight 60.102 kg (132 lb 8 oz), SpO2 100.00%. No results found. Results for orders placed during the hospital encounter of 07/14/14 (from the past 72 hour(s))  GLUCOSE, CAPILLARY     Status: Abnormal   Collection Time    07/26/14 11:42 AM      Result Value Ref Range   Glucose-Capillary 107 (*) 70 - 99 mg/dL   Comment 1 Notify RN    GLUCOSE, CAPILLARY     Status: Abnormal   Collection Time    07/26/14  4:29 PM       Result Value Ref Range   Glucose-Capillary 236 (*) 70 - 99 mg/dL  GLUCOSE, CAPILLARY     Status: Abnormal   Collection Time    07/26/14  8:51 PM      Result Value Ref Range   Glucose-Capillary 105 (*) 70 - 99 mg/dL  GLUCOSE, CAPILLARY     Status: Abnormal   Collection Time    07/27/14  6:48 AM      Result Value Ref Range   Glucose-Capillary 69 (*) 70 - 99 mg/dL  GLUCOSE, CAPILLARY     Status: Abnormal   Collection Time    07/27/14  7:22 AM      Result Value Ref Range   Glucose-Capillary 102 (*) 70 - 99 mg/dL   Comment 1 Notify RN    GLUCOSE, CAPILLARY     Status: Abnormal   Collection Time    07/27/14  1:17 PM      Result Value Ref Range   Glucose-Capillary 118 (*) 70 - 99 mg/dL  GLUCOSE, CAPILLARY     Status: Abnormal   Collection Time    07/27/14  4:25 PM      Result Value Ref Range   Glucose-Capillary 135 (*) 70 - 99 mg/dL   Comment 1 Notify RN  GLUCOSE, CAPILLARY     Status: Abnormal   Collection Time    07/27/14  9:17 PM      Result Value Ref Range   Glucose-Capillary 161 (*) 70 - 99 mg/dL  GLUCOSE, CAPILLARY     Status: None   Collection Time    07/28/14  6:48 AM      Result Value Ref Range   Glucose-Capillary 80  70 - 99 mg/dL  GLUCOSE, CAPILLARY     Status: None   Collection Time    07/28/14 11:54 AM      Result Value Ref Range   Glucose-Capillary 79  70 - 99 mg/dL  GLUCOSE, CAPILLARY     Status: Abnormal   Collection Time    07/28/14  4:53 PM      Result Value Ref Range   Glucose-Capillary 276 (*) 70 - 99 mg/dL  GLUCOSE, CAPILLARY     Status: Abnormal   Collection Time    07/28/14  8:30 PM      Result Value Ref Range   Glucose-Capillary 128 (*) 70 - 99 mg/dL  GLUCOSE, CAPILLARY     Status: None   Collection Time    07/29/14  6:44 AM      Result Value Ref Range   Glucose-Capillary 82  70 - 99 mg/dL     HEENT: normal Cardio: RRR and Gr 2/6 SEM  Resp: CTA B/L and unlabored GI: BS positive and NT Extremity:  Pulses positive and No  Edema Skin:   Intact and Other dry Neuro: Alert/Oriented, Dense R HP Musc/Skel:mild groin pain with R hip ROM Gen NAD   Assessment/Plan: 1. 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 2. DVT Prophylaxis/Anticoagulation: SCDs. Monitor for any signs of DVT  3. Pain Management: Tylenol as needed , R groin pain likely from hip, 9/19 R hip Xray,showing mod OA 4. Hypertension. Amlodipine 10 mg daily, lisinopril 40 mg daily, metoprolol 50 mg daily, hydrochlorothiazide 12.5 mg daily.BP on low side, elevated BUN will hold HCTZ Monitor with increased mobility  5. Diabetes mellitus with peripheral neuropathy, had bilateral foot pain early this am, trial gabapentin qhs. Lantus insulin 25 units daily, will reduce slightly secondary to low am CBG. Check blood sugars a.c. and at bedtime  6. Hyperlipidemia. Atorvastatin 80 mg daily  7. Urinary tract infection. Cipro 500 mg twice a day completed, monitor for recurrence of symptoms. Continues to have urinary frequency oxybutynin 5 mg one half tablet twice a day for history of urinary incontinence, increased 1 tab LOS (Days) 15 A FACE TO FACE EVALUATION WAS PERFORMED    Rogelia Boga 07/29/2014, 8:56 AM

## 2014-07-29 NOTE — Progress Notes (Signed)
Patient declined use of SCDs. Educated patient on their importance, patient voiced understanding.  Diamantina Monks, RN

## 2014-07-29 NOTE — Progress Notes (Signed)
Physical Therapy Session Note  Patient Details  Name: Megan Moore MRN: [id number] Date of Birth: November 07, 1940  Today's Date: 07/29/2014 PT Individual Time: 0800-0900 PT Individual Time Calculation (min): 60 min   Short Term Goals: Week 3:  PT Short Term Goal 1 (Week 3): STG=LTG due to LOS  Skilled Therapeutic Interventions/Progress Updates:    Pt received semi reclined in bed; asleep but easily aroused. Agreeable to therapy. Session focused on functional transfers, facilitating midline posture in standing. Pt performed supine>sit with supervision with HOB flat using rail. Donned Teds, L AFO, and shoesPt performed w/c mobility x110' total in controlled environment with L hemi technique and supervision, cueing for attention to R side. Transported pt remainder of distance to gym, where pt performed multiple sit<>stand transfers from mat table with rolling walker and min A, tactile cueing at L knee, and cueing for safe hand placement with inconsistent within-session carryover. See below for detailed description of NMR interventions. Pt demonstrating signs of dizziness/disequilibrium after sit>stand. When questioned, pt reporting lightheadedness with sit>stand transfer. Seated vitals: BP 133/68 and HR 85 (asymptomatic). Standing vitals: 135/47 and HR 98 and symptomatic. RN notified. After seated rest break, performed sit>stand then immediately transitioned to 1/2 squats in standing with no onset of lightheadedness; therefore, transitioned to gait x10' in controlled environment with rolling walker, R hand orthosis, and max A (+2A for w/c follow due to symptoms of lightheadedness earlier in session). Session ended in pt room, where pt was left seated in w/c with quick release belt in place for safety and all needs within reach.   Therapy Documentation Precautions:  Precautions Precautions: Fall Precaution Comments: pt pushes toward hemiplegic side Restrictions Weight Bearing Restrictions: No Vital  Signs: Therapy Vitals Temp: 98.2 F (36.8 C) Temp src: Oral Pulse Rate: 98 Resp: 18 BP: 135/47 mmHg Patient Position (if appropriate): Standing (Pt reporting lightheadedness) Oxygen Therapy SpO2: 99 % O2 Device: None (Room air) Pain: Pain Assessment Pain Assessment: No/denies pain Locomotion : Ambulation Ambulation/Gait Assistance: 1: +2 Total assist;2: Max assist (+2A for w/c follow)  NMR: Focused on increasing midline orientation in standing. Pt performed static standing with bilat UE support at rolling walker with R hand orthosis requiring min-mod A for stability. Transitioned to static standing without use of rolling walker due to pt tendency to use LUE to push to R side. Even without use of walker, pt demonstrated narrow BOS, R lateropulsion. Mirror positioned anterior to pt for visual feedback. Suspect minimal change in postural alignment with use of mirror is due to impaired subjective visual vertical.  See FIM for current functional status  Therapy/Group: Individual Therapy  Hobble, Lorenda Ishihara 07/29/2014, 9:06 AM

## 2014-07-30 ENCOUNTER — Inpatient Hospital Stay (HOSPITAL_COMMUNITY): Payer: Medicare PPO

## 2014-07-30 LAB — GLUCOSE, CAPILLARY
GLUCOSE-CAPILLARY: 70 mg/dL (ref 70–99)
GLUCOSE-CAPILLARY: 209 mg/dL — AB (ref 70–99)
GLUCOSE-CAPILLARY: 119 mg/dL — AB (ref 70–99)
Glucose-Capillary: 99 mg/dL (ref 70–99)

## 2014-07-30 NOTE — Progress Notes (Signed)
Occupational Therapy Session Note  Patient Details  Name: Megan Moore MRN: [id number] Date of Birth: [birth year]  Today's Date: 07/30/2014 OT Individual Time: 1130-1200 OT Individual Time Calculation (min): 30 min    Short Term Goals: Week 2:  OT Short Term Goal 1 (Week 2): Pt will be able to transfer onto a tub bench in the walk in shower with mod A using grab bars and a squat pivot. OT Short Term Goal 2 (Week 2): Pt will be able to stand at the sink with min- mod A (versus mod-max A) to increase Independence with LB self care. OT Short Term Goal 3 (Week 2): Pt will demonstrate improved functional use of RUE to wash L arm with min A. OT Short Term Goal 4 (Week 2): Pt will be able to don pants over R foot and pull pants over her hips while being supported in standing.  Skilled Therapeutic Interventions/Progress Updates:    Pt resting in w/c upon arrival and requested to use toilet.  Pt performed stand pivot transfer and performed sit<>stand using grab bar.  Pt required mod A for transfer and standing balance while therapist managed clothing.  Pt transitioned to therapy gym for unsupported seated tasks with emphasis on isolated movement of RUE with support.  Pt required max verbal cues to reduce recruitment of muscle groups when attempting to use RUE for tasks and closed chain activities.  Pt required max verbal cues for safety awareness and for leaning forward during transfers.  Therapy Documentation Precautions:  Precautions Precautions: Fall Precaution Comments: pt pushes toward hemiplegic side Restrictions Weight Bearing Restrictions: No   Pain: Pain Assessment Pain Score: 0-No pain  See FIM for current functional status  Therapy/Group: Individual Therapy  Rich Brave 07/30/2014, 12:26 PM

## 2014-07-30 NOTE — Plan of Care (Signed)
Problem: RH PAIN MANAGEMENT Goal: RH STG PAIN MANAGED AT OR BELOW [name] PAIN GOAL < 4  Outcome: Progressing Uses Tylenol for Arthritic pain with relief

## 2014-07-30 NOTE — Progress Notes (Signed)
Patient ID: WINIFRED BODIFORD, female   DOB: 09/30/1941, 73 y.o.   MRN: [id number]  Patient ID: MERSEDES ALBER, female   DOB: [birth year], 73 y.o.   MRN: [id number]  07/30/14.  Subjective/Complaints:  73 year old right-handed female with history of hypertension as well as diabetes mellitus with peripheral neuropathy.  Presented to [facility] 07[facility]12/2014 with acute onset of right-sided weakness. Cranial CT scan negative. MRI of the brain showed acute infarct left side of the internal capsule as well as remote lacunar infarct of the thalami bilaterally, the left caudate head and posterior right pons. Patient did not receive TPA. Echocardiogram unremarkable. Carotid Dopplers with no ICA stenosis.  Neurology services consulted placed on aspirin for CVA prophylaxis.   Complains of R foot pain this am.  No past medical history on file.  Patient Vitals for the past 24 hrs:  BP Temp Temp src Pulse Resp SpO2  07/30/14 0527 127/60 mmHg 98.2 F (36.8 C) Oral 59 17 98 %  07/29/14 1946 142/62 mmHg 98.3 F (36.8 C) Oral 62 17 100 %  07/29/14 1436 105/58 mmHg 98.2 F (36.8 C) Oral 65 16 100 %  07/29/14 0841 135/47 mmHg - - 98 - 99 %  07/29/14 0840 133/68 mmHg - - 85 - 100 %     Intake/Output Summary (Last 24 hours) at 07/30/14 7829 Last data filed at 07/29/14 1800  Gross per 24 hour  Intake    600 ml  Output      0 ml  Net    600 ml    No results found for this basename: HGBA1C   CBG (last 3)   Recent Labs  07/29/14 1640 07/29/14 2034 07/30/14 0640  GLUCAP 170* 120* 70     Review of Systems - Negative except R side weak and R foot pain  Objective: Vital Signs: Blood pressure 127/60, pulse 59, temperature 98.2 F (36.8 C), temperature source Oral, resp. rate 17, weight 60.102 kg (132 lb 8 oz), SpO2 98.00%. No results found. Results for orders placed during the hospital encounter of 07/14/14 (from the past 72 hour(s))  GLUCOSE, CAPILLARY     Status: Abnormal   Collection Time    07/27/14  1:17 PM      Result Value Ref Range   Glucose-Capillary 118 (*) 70 - 99 mg/dL  GLUCOSE, CAPILLARY     Status: Abnormal   Collection Time    07/27/14  4:25 PM      Result Value Ref Range   Glucose-Capillary 135 (*) 70 - 99 mg/dL   Comment 1 Notify RN    GLUCOSE, CAPILLARY     Status: Abnormal   Collection Time    07/27/14  9:17 PM      Result Value Ref Range   Glucose-Capillary 161 (*) 70 - 99 mg/dL  GLUCOSE, CAPILLARY     Status: None   Collection Time    07/28/14  6:48 AM      Result Value Ref Range   Glucose-Capillary 80  70 - 99 mg/dL  GLUCOSE, CAPILLARY     Status: None   Collection Time    07/28/14 11:54 AM      Result Value Ref Range   Glucose-Capillary 79  70 - 99 mg/dL  GLUCOSE, CAPILLARY     Status: Abnormal   Collection Time    07/28/14  4:53 PM      Result Value Ref Range   Glucose-Capillary 276 (*) 70 - 99 mg/dL  GLUCOSE, CAPILLARY  Status: Abnormal   Collection Time    07/28/14  8:30 PM      Result Value Ref Range   Glucose-Capillary 128 (*) 70 - 99 mg/dL  GLUCOSE, CAPILLARY     Status: None   Collection Time    07/29/14  6:44 AM      Result Value Ref Range   Glucose-Capillary 82  70 - 99 mg/dL  GLUCOSE, CAPILLARY     Status: Abnormal   Collection Time    07/29/14 11:38 AM      Result Value Ref Range   Glucose-Capillary 176 (*) 70 - 99 mg/dL   Comment 1 Notify RN    GLUCOSE, CAPILLARY     Status: Abnormal   Collection Time    07/29/14  4:40 PM      Result Value Ref Range   Glucose-Capillary 170 (*) 70 - 99 mg/dL   Comment 1 Notify RN    GLUCOSE, CAPILLARY     Status: Abnormal   Collection Time    07/29/14  8:34 PM      Result Value Ref Range   Glucose-Capillary 120 (*) 70 - 99 mg/dL   Comment 1 Notify RN    GLUCOSE, CAPILLARY     Status: None   Collection Time    07/30/14  6:40 AM      Result Value Ref Range   Glucose-Capillary 70  70 - 99 mg/dL   Comment 1 Notify RN       HEENT: normal Cardio: RRR and Gr  2/6 SEM  Resp: CTA B/L and unlabored GI: BS positive and NT Extremity:  Pulses positive and No Edema; no calf tenderness or swelling Skin:   Intact and Other dry Neuro: Alert/Oriented, Dense R HP Musc/Skel:mild groin pain with R hip ROM Gen NAD   Assessment/Plan: 1. 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 2. DVT Prophylaxis/Anticoagulation: SCDs. Monitor for any signs of DVT  3. Pain Management: Tylenol as needed , R groin pain likely from hip, 9/19 R hip Xray,showing mod OA 4. Hypertension. Amlodipine 10 mg daily, lisinopril 40 mg daily, metoprolol 50 mg daily, hydrochlorothiazide 12.5 mg daily.BP on low side, elevated BUN will hold HCTZ Monitor with increased mobility  5. Diabetes mellitus with peripheral neuropathy, had bilateral foot pain early this am, trial gabapentin qhs. Lantus insulin 25 units daily, will reduce slightly secondary to low am CBG. Check blood sugars a.c. and at bedtime  6. Hyperlipidemia. Atorvastatin 80 mg daily  7. Urinary tract infection. Cipro 500 mg twice a day completed, monitor for recurrence of symptoms. Continues to have urinary frequency oxybutynin 5 mg one half tablet twice a day for history of urinary incontinence, increased 1 tab LOS (Days) 16 A FACE TO FACE EVALUATION WAS PERFORMED    Rogelia Boga 07/30/2014, 8:22 AM

## 2014-07-31 ENCOUNTER — Inpatient Hospital Stay (HOSPITAL_COMMUNITY): Payer: Medicare PPO

## 2014-07-31 ENCOUNTER — Inpatient Hospital Stay (HOSPITAL_COMMUNITY): Payer: Medicare PPO | Admitting: Occupational Therapy

## 2014-07-31 DIAGNOSIS — Z5189 Encounter for other specified aftercare: Secondary | ICD-10-CM

## 2014-07-31 DIAGNOSIS — G8110 Spastic hemiplegia affecting unspecified side: Secondary | ICD-10-CM

## 2014-07-31 DIAGNOSIS — I6330 Cerebral infarction due to thrombosis of unspecified cerebral artery: Secondary | ICD-10-CM

## 2014-07-31 LAB — GLUCOSE, CAPILLARY
Glucose-Capillary: 82 mg/dL (ref 70–99)
Glucose-Capillary: 97 mg/dL (ref 70–99)
Glucose-Capillary: 118 mg/dL — ABNORMAL HIGH (ref 70–99)
Glucose-Capillary: 107 mg/dL — ABNORMAL HIGH (ref 70–99)

## 2014-07-31 NOTE — Progress Notes (Addendum)
Physical Therapy Session Note  Patient Details  Name: Megan Moore MRN: [id number] Date of Birth: February 28, 1941  Today's Date: 07/31/2014 PT Individual Time:1005-1105;  1350-1450 PT Individual Time Calculation (min): 60 min , 60 min  Short Term Goals: Week 3:  PT Short Term Goal 1 (Week 3): STG=LTG due to LOS  Skilled Therapeutic Interventions/Progress Updates:  LTGs adjusted to reflect pt's continued deficits in balance during transfer and standing activities, and focus on family ed for safe d/c home 08/04/14.  Tx 1:  Family ed with husband and sister Veva Holes.  Both family members observed and demonstrated bed mobility, basic transfers; they need more practice and plan to return for further sessions.  They observed pt propelling w/c and her difficulty turning L to avoid obstacles. Home ramp is being built this week. Pt performed 8 x 1 each bil bridging and lower trunk rotation in supine, with assistance for RLE .  Discussed HEP with family; hand out to follow.  Tx 2:  Wearing RAFO  neuromuscular re-education via forced use, manual and visual cues for : Components of squat pivot transfer: wt shifting forward, = wt bearing bil LEs Scooting L><R on mat with bil UEs reaching forward Step/taps onto 5" high step, 2 rails, with max assist for R stance stability  Gait up/down 1 step, 2 rails, max assist Kinetron in high sitting, without UE support, focusing on upright posture with R and L max extension  Pt left sitting up in w/c in room, all needs within reach and quick release belt in place    Therapy Documentation Precautions:  Precautions Precautions: Fall Precaution Comments: pt pushes toward hemiplegic side Restrictions Weight Bearing Restrictions: No   Pain: Pain Assessment Pain Assessment: No/denies pain      See FIM for current functional status  Therapy/Group: Individual Therapy  Thomasa Heidler 07/31/2014, 3:09 PM

## 2014-07-31 NOTE — Progress Notes (Signed)
Occupational Therapy Weekly Progress Note  Patient Details  Name: Megan Moore MRN: [id number] Date of Birth: [birth year]  Beginning of progress report period: July 24, 2014 End of progress report period: July 31, 2014  Today's Date: 07/31/2014 OT Individual Time: 0900-1000 OT Individual Time Calculation (min): 60 min    Patient has met 4 of 4 short term goals.  Pt has been making steady progress with her self care skills, balance, and RUE functional use.  Patient continues to demonstrate the following deficits: R hemiparesis, decreased postural control and midline postural awareness, decreased balance and therefore will continue to benefit from skilled OT intervention to enhance overall performance with BADL.  Patient progressing toward some of her long term goals, other goals have been modified.  Plan of care revisions: LTGs of toileting, toilet transfers, and LB dressing modified to mod A (versus min A). 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  OT Short Term Goals Week 1:  OT Short Term Goal 1 (Week 1): Pt will perform LB dressing with Max A in order to increase I with self care OT Short Term Goal 1 - Progress (Week 1): Met OT Short Term Goal 2 (Week 1): Pt will perform shower transfer with Mod A in order to increase I in functional transfers. OT Short Term Goal 2 - Progress (Week 1): Other (comment) (We have not tried shower transfers yet, due to decreased sitting balance.) OT Short Term Goal 3 (Week 1): Pt will perform toilet transfer with Mod A in order to increase I in functional transfers.  OT Short Term Goal 3 - Progress (Week 1): Met OT Short Term Goal 4 (Week 1): Pt will perform toileting with Mod A in order to increase I with self care. OT Short Term Goal 4 - Progress (Week 1): Partly met OT Short Term Goal 5 (Week 1): Pt perform UB dressing with Min A in  order to increase I in self care.  OT Short Term Goal 5 - Progress (Week 1): Met Week 2:  OT Short Term Goal 1 (Week 2): Pt will be able to transfer onto a tub bench in the walk in shower with mod A using grab bars and a squat pivot. OT Short Term Goal 1 - Progress (Week 2): Met OT Short Term Goal 2 (Week 2): Pt will be able to stand at the sink with min- mod A (versus mod-max A) to increase Independence with LB self care. OT Short Term Goal 2 - Progress (Week 2): Met OT Short Term Goal 3 (Week 2): Pt will demonstrate improved functional use of RUE to wash L arm with min A. OT Short Term Goal 3 - Progress (Week 2): Met OT Short Term Goal 4 (Week 2): Pt will be able to don pants over R foot and pull pants over her hips while being supported in standing. OT Short Term Goal 4 - Progress (Week 2): Met Week 3:  OT Short Term Goal 1 (Week 3): STGs = LTGS  Skilled Therapeutic Interventions/Progress Updates:    Pt seen for BADL retraining with a focus on family education with pt's spouse and her sister. Demonstrated to family pt's bed mobility and transfer skills with much discussion on pt's foot placement, body positioning for a safe transfer. Only demonstrated to family as they would practice the transfers during her next PT session. Pt worked on all of her self  care, with continued demonstration and explanation to family of hemi dressing techniques, hand over hand assist for RUE as needed, when to assist and when to just supervise. Pt stood at sink with this OT to cleanse her bottom with min -mod A as pt leaned to R 50% of the time but would find her midline with cues. [name] sister then practiced standing with her as she pulled her underwear up and her husband stood with her as she pulled her pants up. They both noted that they could feel her leaning, but were able to cue her to put more weight over her left foot. Pt did actively use her R hand several times to assist with pulling up her clothing.  Pt  completed self care and then was taken to gym to prepare for her PT session.    Therapy Documentation Precautions:  Precautions Precautions: Fall Precaution Comments: pt pushes toward hemiplegic side Restrictions Weight Bearing Restrictions: No    Vital Signs: Therapy Vitals Temp: 98.1 F (36.7 C) Temp src: Oral Pulse Rate: 76 Resp: 18 BP: 149/53 mmHg Oxygen Therapy SpO2: 100 % O2 Device: None (Room air)  Pain: no c/o pain   ADL:  See FIM for current functional status  Therapy/Group: Individual Therapy  [place] 07/31/2014, 8:01 AM

## 2014-07-31 NOTE — Progress Notes (Signed)
Subjective/Complaints: 73 year old right-handed female with history of hypertension as well as diabetes mellitus with peripheral neuropathy. Presented to [facility] 07/12/2014 with acute onset of right-sided weakness. Cranial CT scan negative. MRI of the brain showed acute infarct left side of the internal capsule as well as remote lacunar infarct of the thalami bilaterally, the left caudate head and posterior right pons. Patient did not receive TPA. Echocardiogram unremarkable. Carotid Dopplers with no ICA stenosis. Neurology services consulted placed on aspirin for CVA prophylaxis.   No foot pain Family coming in for training today  Review of Systems - Negative except R side weak  Objective: Vital Signs: Blood pressure 151/60, pulse 74, temperature 98.1 F (36.7 C), temperature source Oral, resp. rate 18, weight 60.102 kg (132 lb 8 oz), SpO2 100.00%. No results found. Results for orders placed during the hospital encounter of 07/14/14 (from the past 72 hour(s))  GLUCOSE, CAPILLARY     Status: None   Collection Time    07/28/14 11:54 AM      Result Value Ref Range   Glucose-Capillary 79  70 - 99 mg/dL  GLUCOSE, CAPILLARY     Status: Abnormal   Collection Time    07/28/14  4:53 PM      Result Value Ref Range   Glucose-Capillary 276 (*) 70 - 99 mg/dL  GLUCOSE, CAPILLARY     Status: Abnormal   Collection Time    07/28/14  8:30 PM      Result Value Ref Range   Glucose-Capillary 128 (*) 70 - 99 mg/dL  GLUCOSE, CAPILLARY     Status: None   Collection Time    07/29/14  6:44 AM      Result Value Ref Range   Glucose-Capillary 82  70 - 99 mg/dL  GLUCOSE, CAPILLARY     Status: Abnormal   Collection Time    07/29/14 11:38 AM      Result Value Ref Range   Glucose-Capillary 176 (*) 70 - 99 mg/dL   Comment 1 Notify RN    GLUCOSE, CAPILLARY     Status: Abnormal   Collection Time    07/29/14  4:40 PM      Result Value Ref Range   Glucose-Capillary 170 (*) 70 - 99  mg/dL   Comment 1 Notify RN    GLUCOSE, CAPILLARY     Status: Abnormal   Collection Time    07/29/14  8:34 PM      Result Value Ref Range   Glucose-Capillary 120 (*) 70 - 99 mg/dL   Comment 1 Notify RN    GLUCOSE, CAPILLARY     Status: None   Collection Time    07/30/14  6:40 AM      Result Value Ref Range   Glucose-Capillary 70  70 - 99 mg/dL   Comment 1 Notify RN    GLUCOSE, CAPILLARY     Status: None   Collection Time    07/30/14 12:31 PM      Result Value Ref Range   Glucose-Capillary 99  70 - 99 mg/dL  GLUCOSE, CAPILLARY     Status: Abnormal   Collection Time    07/30/14  4:23 PM      Result Value Ref Range   Glucose-Capillary 209 (*) 70 - 99 mg/dL  GLUCOSE, CAPILLARY     Status: Abnormal   Collection Time    07/30/14  8:44 PM      Result Value Ref Range   Glucose-Capillary 119 (*) 70 -  99 mg/dL   Comment 1 Notify RN    GLUCOSE, CAPILLARY     Status: None   Collection Time    07/31/14  6:41 AM      Result Value Ref Range   Glucose-Capillary 82  70 - 99 mg/dL   Comment 1 Notify RN       HEENT: normal Cardio: RRR and no murmur Resp: CTA B/L and unlabored GI: BS positive and NT Extremity:  Pulses positive and No Edema Skin:   Intact and Other dry Neuro: Alert/Oriented, Normal Sensory, Abnormal Motor 3- R delt, bi, tri grip, 2-/5 RLE HF, 3- KE, trace at anklet Reflexes: 3+ Musc/Skel:mild groin pain with R hip ROM Gen NAD   Assessment/Plan: 1. 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 Physiatrist is providing close team supervision and 24 hour management of active medical problems listed below. Physiatrist and rehab team continue to assess barriers to discharge/monitor patient progress toward functional and medical goals. FIM: FIM - Bathing Bathing Steps Patient Completed: Chest;Right Arm;Abdomen;Front perineal  area;Right upper leg;Left upper leg;Left lower leg (including foot) Bathing: 3: Mod-Patient completes 5-7 12f 10 parts or 50-74%  FIM - Upper Body Dressing/Undressing Upper body dressing/undressing steps patient completed: Put head through opening of pull over shirt/dress;Thread/unthread left sleeve of pullover shirt/dress;Pull shirt over trunk;Thread/unthread right sleeve of pullover shirt/dresss Upper body dressing/undressing: 5: Supervision: Safety issues/verbal cues FIM - Lower Body Dressing/Undressing Lower body dressing/undressing steps patient completed: Thread/unthread right pants leg;Thread/unthread left pants leg;Pull pants up/down Lower body dressing/undressing: 3: Mod-Patient completed 50-74% of tasks  FIM - Toileting Toileting steps completed by patient: Performs perineal hygiene Toileting Assistive Devices: Grab bar or rail for support Toileting: 2: Max-Patient completed 1 of 3 steps  FIM - Diplomatic Services operational officer Devices: Grab bars Toilet Transfers: 3-From toilet/BSC: Mod A (lift or lower assist);3-To toilet/BSC: Mod A (lift or lower assist)  FIM - Bed/Chair Transfer Bed/Chair Transfer Assistive Devices: Arm rests;Bed rails Bed/Chair Transfer: 3: Bed > Chair or W/C: Mod A (lift or lower assist);3: Chair or W/C > Bed: Mod A (lift or lower assist)  FIM - Locomotion: Wheelchair Distance: 110 Locomotion: Wheelchair: 2: Travels 50 - 149 ft with supervision, cueing or coaxing FIM - Locomotion: Ambulation Locomotion: Ambulation Assistive Devices: Orthosis;Other (comment);Walker - Rolling (R hand orthosis, R AFO) Ambulation/Gait Assistance: 1: +2 Total assist;2: Max assist (+2A for w/c follow) Locomotion: Ambulation: 1: Two helpers  Comprehension Comprehension Mode: Auditory Comprehension: 5-Understands complex 90% of the time/Cues < 10% of the time  Expression Expression Mode: Verbal Expression: 5-Expresses basic needs/ideas: With no assist  Social  Interaction Social Interaction: 5-Interacts appropriately 90% of the time - Needs monitoring or encouragement for participation or interaction.  Problem Solving Problem Solving: 5-Solves basic problems: With no assist  Memory Memory: 4-Recognizes or recalls 75 - 89% of the time/requires cueing 10 - 24% of the time   1. Functional deficits secondary to left CVA with right-sided weakness  2. DVT Prophylaxis/Anticoagulation: SCDs. Monitor for any signs of DVT  3. Pain Management: Tylenol as needed , R groin pain likely from hip, 9/19 R hip Xray,showing mod OA 4. Hypertension. Amlodipine 10 mg daily, lisinopril 40 mg daily, metoprolol 50 mg daily, hydrochlorothiazide 12.5 mg daily.BP on low side, elevated BUN will hold HCTZ Monitor with increased mobility  5. Neuropsych: This patient is capable of making decisions  on her own behalf.  6. Skin/Wound Care: Routine skin checks  7. Diabetes mellitus with peripheral neuropathy, had bilateral foot pain early this am, trial gabapentin qhs. Lantus insulin 25 units daily, will reduce slightly secondary to low am CBG. Check blood sugars a.c. and at bedtime  8. Hyperlipidemia. Atorvastatin 80 mg daily  9. Urinary tract infection. Cipro 500 mg twice a day completed, monitor for recurrence of symptoms. Continues to have urinary frequency oxybutynin 5 mg one half tablet twice a day for history of urinary incontinence, increased 1 tab LOS (Days) 17 A FACE TO FACE EVALUATION WAS PERFORMED    KIRSTEINS,ANDREW E 07/31/2014, 8:39 AM

## 2014-08-01 ENCOUNTER — Inpatient Hospital Stay (HOSPITAL_COMMUNITY): Payer: Medicare PPO | Admitting: *Deleted

## 2014-08-01 ENCOUNTER — Inpatient Hospital Stay (HOSPITAL_COMMUNITY): Payer: Medicare PPO | Admitting: Occupational Therapy

## 2014-08-01 LAB — GLUCOSE, CAPILLARY
GLUCOSE-CAPILLARY: 81 mg/dL (ref 70–99)
GLUCOSE-CAPILLARY: 99 mg/dL (ref 70–99)
Glucose-Capillary: 129 mg/dL — ABNORMAL HIGH (ref 70–99)

## 2014-08-01 NOTE — Progress Notes (Signed)
Occupational Therapy Session Note  Patient Details  Name: Megan Moore MRN: [id number] Date of Birth: 1940-12-01  Today's Date: 08/01/2014 OT Individual Time: 0900-1000 OT Individual Time Calculation (min): 60 min    Short Term Goals: Week 1:  OT Short Term Goal 1 (Week 1): Pt will perform LB dressing with Max A in order to increase I with self care OT Short Term Goal 1 - Progress (Week 1): Met OT Short Term Goal 2 (Week 1): Pt will perform shower transfer with Mod A in order to increase I in functional transfers. OT Short Term Goal 2 - Progress (Week 1): Other (comment) (We have not tried shower transfers yet, due to decreased sitting balance.) OT Short Term Goal 3 (Week 1): Pt will perform toilet transfer with Mod A in order to increase I in functional transfers.  OT Short Term Goal 3 - Progress (Week 1): Met OT Short Term Goal 4 (Week 1): Pt will perform toileting with Mod A in order to increase I with self care. OT Short Term Goal 4 - Progress (Week 1): Partly met OT Short Term Goal 5 (Week 1): Pt perform UB dressing with Min A in order to increase I in self care.  OT Short Term Goal 5 - Progress (Week 1): Met Week 2:  OT Short Term Goal 1 (Week 2): Pt will be able to transfer onto a tub bench in the walk in shower with mod A using grab bars and a squat pivot. OT Short Term Goal 1 - Progress (Week 2): Met OT Short Term Goal 2 (Week 2): Pt will be able to stand at the sink with min- mod A (versus mod-max A) to increase Independence with LB self care. OT Short Term Goal 2 - Progress (Week 2): Met OT Short Term Goal 3 (Week 2): Pt will demonstrate improved functional use of RUE to wash L arm with min A. OT Short Term Goal 3 - Progress (Week 2): Met OT Short Term Goal 4 (Week 2): Pt will be able to don pants over R foot and pull pants over her hips while being supported in standing. OT Short Term Goal 4 - Progress (Week 2): Met Week 3:  OT Short Term Goal 1 (Week 3): STGs =  LTGS  Skilled Therapeutic Interventions/Progress Updates:    Pt seen this session for ADL retraining of toileting, toilet transfers, and dressing with a focus on postural control and family education with pt's spouse and sister. Pt declined bathing this morning. Pt received sitting  In w/c and she needed to use the bathroom.  Demonstrated to the family how pt can also perform a stand pivot transfer using a grab bar on her L side. This grab bar also helps her to stabilize herself in standing prior to clothing adjustment. Discussed with husband how he could install a wall to floor grab bar between the toilet and tub on her L side. Pt dressed self with mod A for LB for donning shoes. When she stood to pull pants over her hips, her balance was much more stable than it had been in the past as she only needed steady A. Pt then practiced drop arm BSC transfers 3x. Once with therapist, once with her spouse and once with her sister from w/c to [facility] and back using squat pivot. Instructed them to support her at her rib cage/ torso under her arms as pt will not be wearing pants when she first wakes up in the morning. Pt was  able to lean forward but continues to tend to push back into hyper extension at the end range of movement. Reinforced this with the pt, but will need continued practice.  Pt and her family taken to tub room to discuss width of w/c, width of her bathroom door, arrangement of her bathroom, use of a tub bench (if door is wide enough). Spouse will measure door width. [name] PT arrived for her next session.  Therapy Documentation Precautions:  Precautions Precautions: Fall Precaution Comments: pt pushes toward hemiplegic side Restrictions Weight Bearing Restrictions: No    Vital Signs: Therapy Vitals Pulse Rate: 71 BP: 119/61 mmHg Pain: Pain Assessment Pain Assessment: No/denies pain  ADL:  See FIM for current functional status  Therapy/Group: Individual Therapy  [place] 08/01/2014,  11:19 AM

## 2014-08-01 NOTE — Progress Notes (Signed)
Physical Therapy Session Note  Patient Details  Name: Jerlyn LyMary C Langford MRN: [id number] Date of Birth: July 25, 1941  Today's Date: 08/01/2014 PT Individual Time:10:00-11:00 and [id number] PT Individual Time Calculation (min): 60 min and 60min   Short Term Goals: Week 3:  PT Short Term Goal 1 (Week 3): STG=LTG due to LOS  Skilled Therapeutic Interventions/Progress Updates:  First tx focused on family training for varying transfers as well as WC ramp management.  Instructed spouse and sister in transfer training at apartment bed, car, and mat with mod/max A. Provided demonstration on WC positioning, parts management, and positioning for Bobath method over back with arms outstretched in opposite direction as hips. Family needed several cues for positioning pt in a way that she has enough anterior translation. During car transfer, pt was allowed to pull up on inner door for car entry due to difficultly with positioning, but needing Bobath method to exit as she pushed too far posteriorly.  Performed bed mobility with S, not following any rolling cues.  Educated family in NMR supine exercise with handouts for lower trunk rotation, bridging, and half-bridging for increased R-sided motor control.   [facility], which he was able to complete with no difficulty for home entry.  [facility] and all needs in reach.   Second tx focused on WC propulsion, standing balance, transfers, and gait with hemi-walker.  [facility] challenged to get self all the way to gym, which she was able to do with increased time.  Performed basic transfers with Mod A to mat, needing max cues for anterior translation.  Performed lateral scooting along mat for increased pelvic excursion.   Instructed pt in sit<>stands with mirror for visual feedback. Pt needed Mod A for standing with manual facilitation and NMR at hips for ext and R knee for neutral guarding. Pt able to generate some  knee ext in standing. Pt challenged to use 1/0 UEs on therapists shoulders. Pt then used hemi-walker for static standing with lateral hip shifting for increased challenge and balance.   Performed gait with hemi-walker and +2 assist for L weight shift as well as RLE management. Pt able to advance LE, but needing assist for accurate placement and stability in stance. Pt needed +2 for device management and placement as she tends to place it in front of her.   Pt returned to bed with all needs in reach.       Therapy Documentation Precautions:  Precautions Precautions: Fall Precaution Comments: pt pushes toward hemiplegic side Restrictions Weight Bearing Restrictions: No General:   Vital Signs: Therapy Vitals Temp: 98.1 F (36.7 C) Temp src: Oral Pulse Rate: 61 Resp: 18 BP: 102/60 mmHg Patient Position (if appropriate): Lying Oxygen Therapy SpO2: 100 % O2 Device: None (Room air) Pain: none both tx    Locomotion : Ambulation Ambulation/Gait Assistance: 1: +2 Total assist Wheelchair Mobility Distance: 200   See FIM for current functional status  Therapy/Group: Individual Therapy Clydene Lamingole Andrey Hoobler, PT, DPT   08/01/2014, 4:20 PM

## 2014-08-01 NOTE — Progress Notes (Signed)
Subjective/Complaints: 73 year old right-handed female with history of hypertension as well as diabetes mellitus with peripheral neuropathy. Presented to [facility] 07/12/2014 with acute onset of right-sided weakness. Cranial CT scan negative. MRI of the brain showed acute infarct left side of the internal capsule as well as remote lacunar infarct of the thalami bilaterally, the left caudate head and posterior right pons. Patient did not receive TPA. Echocardiogram unremarkable. Carotid Dopplers with no ICA stenosis. Neurology services consulted placed on aspirin for CVA prophylaxis.   Sweating in bed last noc, burning with urination Feels congested but no breathing problems  Review of Systems - Negative except R side weak  Objective: Vital Signs: Blood pressure 107/54, pulse 67, temperature 98.9 F (37.2 C), temperature source Oral, resp. rate 18, weight 60.102 kg (132 lb 8 oz), SpO2 100.00%. No results found. Results for orders placed during the hospital encounter of 07/14/14 (from the past 72 hour(s))  GLUCOSE, CAPILLARY     Status: Abnormal   Collection Time    07/29/14 11:38 AM      Result Value Ref Range   Glucose-Capillary 176 (*) 70 - 99 mg/dL   Comment 1 Notify RN    GLUCOSE, CAPILLARY     Status: Abnormal   Collection Time    07/29/14  4:40 PM      Result Value Ref Range   Glucose-Capillary 170 (*) 70 - 99 mg/dL   Comment 1 Notify RN    GLUCOSE, CAPILLARY     Status: Abnormal   Collection Time    07/29/14  8:34 PM      Result Value Ref Range   Glucose-Capillary 120 (*) 70 - 99 mg/dL   Comment 1 Notify RN    GLUCOSE, CAPILLARY     Status: None   Collection Time    07/30/14  6:40 AM      Result Value Ref Range   Glucose-Capillary 70  70 - 99 mg/dL   Comment 1 Notify RN    GLUCOSE, CAPILLARY     Status: None   Collection Time    07/30/14 12:31 PM      Result Value Ref Range   Glucose-Capillary 99  70 - 99 mg/dL  GLUCOSE, CAPILLARY     Status:  Abnormal   Collection Time    07/30/14  4:23 PM      Result Value Ref Range   Glucose-Capillary 209 (*) 70 - 99 mg/dL  GLUCOSE, CAPILLARY     Status: Abnormal   Collection Time    07/30/14  8:44 PM      Result Value Ref Range   Glucose-Capillary 119 (*) 70 - 99 mg/dL   Comment 1 Notify RN    GLUCOSE, CAPILLARY     Status: None   Collection Time    07/31/14  6:41 AM      Result Value Ref Range   Glucose-Capillary 82  70 - 99 mg/dL   Comment 1 Notify RN    GLUCOSE, CAPILLARY     Status: None   Collection Time    07/31/14 11:27 AM      Result Value Ref Range   Glucose-Capillary 97  70 - 99 mg/dL  GLUCOSE, CAPILLARY     Status: Abnormal   Collection Time    07/31/14  4:32 PM      Result Value Ref Range   Glucose-Capillary 118 (*) 70 - 99 mg/dL   Comment 1 Notify RN    GLUCOSE, CAPILLARY  Status: Abnormal   Collection Time    07/31/14  8:58 PM      Result Value Ref Range   Glucose-Capillary 107 (*) 70 - 99 mg/dL  GLUCOSE, CAPILLARY     Status: None   Collection Time    08/01/14  7:33 AM      Result Value Ref Range   Glucose-Capillary 81  70 - 99 mg/dL   Comment 1 Notify RN       HEENT: normal Cardio: RRR and no murmur Resp: CTA B/L and unlabored GI: BS positive and NT Extremity:  Pulses positive and No Edema Skin:   Intact and Other dry Neuro: Alert/Oriented, Normal Sensory, Abnormal Motor 3- R delt, bi, tri grip, 2-/5 RLE HF, 3- KE, trace at anklet Reflexes: 3+ Musc/Skel:mild groin pain with R hip ROM Gen NAD   Assessment/Plan: 1. 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 Physiatrist is providing close team supervision and 24 hour management of active medical problems listed below. Physiatrist and rehab team continue to assess barriers to discharge/monitor patient progress toward functional and medical  goals. FIM: FIM - Bathing Bathing Steps Patient Completed: Chest;Right Arm;Abdomen;Front perineal area;Right upper leg;Left upper leg;Left lower leg (including foot);Buttocks;Right lower leg (including foot) Bathing: 4: Min-Patient completes 8-9 98f 10 parts or 75+ percent  FIM - Upper Body Dressing/Undressing Upper body dressing/undressing steps patient completed: Put head through opening of pull over shirt/dress;Thread/unthread left sleeve of pullover shirt/dress;Pull shirt over trunk;Thread/unthread right sleeve of pullover shirt/dresss Upper body dressing/undressing: 5: Supervision: Safety issues/verbal cues FIM - Lower Body Dressing/Undressing Lower body dressing/undressing steps patient completed: Thread/unthread right pants leg;Thread/unthread left pants leg;Thread/unthread right underwear leg;Pull underwear up/down Lower body dressing/undressing: 3: Mod-Patient completed 50-74% of tasks  FIM - Toileting Toileting steps completed by patient: Performs perineal hygiene Toileting Assistive Devices: Grab bar or rail for support Toileting: 2: Max-Patient completed 1 of 3 steps  FIM - Diplomatic Services operational officer Devices: Grab bars Toilet Transfers: 3-From toilet/BSC: Mod A (lift or lower assist);3-To toilet/BSC: Mod A (lift or lower assist)  FIM - Bed/Chair Transfer Bed/Chair Transfer Assistive Devices: Arm rests;Bed rails Bed/Chair Transfer: 5: Supine > Sit: Supervision (verbal cues/safety issues);5: Sit > Supine: Supervision (verbal cues/safety issues);3: Chair or W/C > Bed: Mod A (lift or lower assist);3: Bed > Chair or W/C: Mod A (lift or lower assist)  FIM - Locomotion: Wheelchair Distance: 110 Locomotion: Wheelchair: 2: Travels 50 - 149 ft with supervision, cueing or coaxing FIM - Locomotion: Ambulation Locomotion: Ambulation Assistive Devices: Orthosis;Other (comment);Walker - Rolling (R hand orthosis, R AFO) Ambulation/Gait Assistance: 1: +2 Total assist;2: Max  assist (+2A for w/c follow) Locomotion: Ambulation: 0: Activity did not occur  Comprehension Comprehension Mode: Auditory Comprehension: 5-Understands complex 90% of the time/Cues < 10% of the time  Expression Expression Mode: Verbal Expression: 5-Expresses complex 90% of the time/cues < 10% of the time  Social Interaction Social Interaction: 5-Interacts appropriately 90% of the time - Needs monitoring or encouragement for participation or interaction.  Problem Solving Problem Solving: 5-Solves basic problems: With no assist  Memory Memory: 2-Recognizes or recalls 25 - 49% of the time/requires cueing 51 - 75% of the time   1. Functional deficits secondary to left CVA with right-sided weakness  2. DVT Prophylaxis/Anticoagulation: SCDs. Monitor for any signs of DVT  3. Pain Management: Tylenol as needed , R groin  pain likely from hip, 9/19 R hip Xray,showing mod OA 4. Hypertension. Amlodipine 10 mg daily, lisinopril 40 mg daily, metoprolol 50 mg daily, hydrochlorothiazide 12.5 mg daily.BP on low side, elevated BUN will hold HCTZ Monitor with increased mobility  5. Neuropsych: This patient is capable of making decisions on her own behalf.  6. Skin/Wound Care: Routine skin checks  7. Diabetes mellitus with peripheral neuropathy, had bilateral foot pain early this am, trial gabapentin qhs. Lantus insulin 25 units daily, will reduce slightly secondary to low am CBG. Check blood sugars a.c. and at bedtime  8. Hyperlipidemia. Atorvastatin 80 mg daily  9. Urinary tract infection ? Recurrence, low grade temp x 1. Cipro completed,recultureLOS (Days) 18 A FACE TO FACE EVALUATION WAS PERFORMED    Christophere Hillhouse E 08/01/2014, 8:25 AM

## 2014-08-02 ENCOUNTER — Inpatient Hospital Stay (HOSPITAL_COMMUNITY): Payer: Medicare PPO | Admitting: *Deleted

## 2014-08-02 ENCOUNTER — Inpatient Hospital Stay (HOSPITAL_COMMUNITY): Payer: Medicare PPO | Admitting: Occupational Therapy

## 2014-08-02 LAB — URINALYSIS, ROUTINE W REFLEX MICROSCOPIC
BILIRUBIN URINE: NEGATIVE
GLUCOSE, UA: NEGATIVE mg/dL
HGB URINE DIPSTICK: NEGATIVE
Ketones, ur: NEGATIVE mg/dL
Nitrite: NEGATIVE
PH: 6.5 (ref 5.0–8.0)
Protein, ur: NEGATIVE mg/dL
Specific Gravity, Urine: 1.015 (ref 1.005–1.030)
Urobilinogen, UA: 0.2 mg/dL (ref 0.0–1.0)

## 2014-08-02 LAB — URINE MICROSCOPIC-ADD ON

## 2014-08-02 LAB — GLUCOSE, CAPILLARY
GLUCOSE-CAPILLARY: 116 mg/dL — AB (ref 70–99)
GLUCOSE-CAPILLARY: 93 mg/dL (ref 70–99)
Glucose-Capillary: 65 mg/dL — ABNORMAL LOW (ref 70–99)
Glucose-Capillary: 97 mg/dL (ref 70–99)
Glucose-Capillary: 123 mg/dL — ABNORMAL HIGH (ref 70–99)
Glucose-Capillary: 142 mg/dL — ABNORMAL HIGH (ref 70–99)

## 2014-08-02 MED ORDER — INSULIN GLARGINE 100 UNIT/ML ~~LOC~~ SOLN
20.0000 [IU] | SUBCUTANEOUS | Status: DC
  Administered 2014-08-03 – 2014-08-04 (×2): 20 [IU] via SUBCUTANEOUS
  Filled 2014-08-02 (×3): qty 0.2

## 2014-08-02 NOTE — Progress Notes (Signed)
Hypoglycemic Event  CBG: 65  Treatment: carb snack  Symptoms: None  Follow-up CBG: Time:0708 CBG Result:93  Possible Reasons for Event: Inadequate meal intake  Comments/MD notified Barnetta Chapelan Agiulli,PA    Virdia Ziesmer, Jamesetta OrleansWillie Bernita  Remember to initiate Hypoglycemia Order Set & complete

## 2014-08-02 NOTE — Progress Notes (Signed)
Subjective/Complaints: 73 year old right-handed female with history of hypertension as well as diabetes mellitus with peripheral neuropathy. Presented to [facility] 07/12/2014 with acute onset of right-sided weakness. Cranial CT scan negative. MRI of the brain showed acute infarct left side of the internal capsule as well as remote lacunar infarct of the thalami bilaterally, the left caudate head and posterior right pons. Patient did not receive TPA. Echocardiogram unremarkable. Carotid Dopplers with no ICA stenosis. Neurology services consulted placed on aspirin for CVA prophylaxis.   Sweating in bed last noc, burning with urination Feels congested but no breathing problems  Review of Systems - Negative except R side weak  Objective: Vital Signs: Blood pressure 137/54, pulse 68, temperature 98.7 F (37.1 C), temperature source Oral, resp. rate 18, weight 60.102 kg (132 lb 8 oz), SpO2 100.00%. No results found. Results for orders placed during the hospital encounter of 07/14/14 (from the past 72 hour(s))  GLUCOSE, CAPILLARY     Status: None   Collection Time    07/30/14 12:31 PM      Result Value Ref Range   Glucose-Capillary 99  70 - 99 mg/dL  GLUCOSE, CAPILLARY     Status: Abnormal   Collection Time    07/30/14  4:23 PM      Result Value Ref Range   Glucose-Capillary 209 (*) 70 - 99 mg/dL  GLUCOSE, CAPILLARY     Status: Abnormal   Collection Time    07/30/14  8:44 PM      Result Value Ref Range   Glucose-Capillary 119 (*) 70 - 99 mg/dL   Comment 1 Notify RN    GLUCOSE, CAPILLARY     Status: None   Collection Time    07/31/14  6:41 AM      Result Value Ref Range   Glucose-Capillary 82  70 - 99 mg/dL   Comment 1 Notify RN    GLUCOSE, CAPILLARY     Status: None   Collection Time    07/31/14 11:27 AM      Result Value Ref Range   Glucose-Capillary 97  70 - 99 mg/dL  GLUCOSE, CAPILLARY     Status: Abnormal   Collection Time    07/31/14  4:32 PM   Result Value Ref Range   Glucose-Capillary 118 (*) 70 - 99 mg/dL   Comment 1 Notify RN    GLUCOSE, CAPILLARY     Status: Abnormal   Collection Time    07/31/14  8:58 PM      Result Value Ref Range   Glucose-Capillary 107 (*) 70 - 99 mg/dL  GLUCOSE, CAPILLARY     Status: None   Collection Time    08/01/14  7:33 AM      Result Value Ref Range   Glucose-Capillary 81  70 - 99 mg/dL   Comment 1 Notify RN    GLUCOSE, CAPILLARY     Status: Abnormal   Collection Time    08/01/14 11:28 AM      Result Value Ref Range   Glucose-Capillary 129 (*) 70 - 99 mg/dL   Comment 1 Notify RN    GLUCOSE, CAPILLARY     Status: None   Collection Time    08/01/14  4:37 PM      Result Value Ref Range   Glucose-Capillary 99  70 - 99 mg/dL   Comment 1 Notify RN    GLUCOSE, CAPILLARY     Status: Abnormal   Collection Time    08/01/14  9:08  PM      Result Value Ref Range   Glucose-Capillary 116 (*) 70 - 99 mg/dL  URINALYSIS, ROUTINE W REFLEX MICROSCOPIC     Status: Abnormal   Collection Time    08/02/14  4:38 AM      Result Value Ref Range   Color, Urine YELLOW  YELLOW   APPearance CLEAR  CLEAR   Specific Gravity, Urine 1.015  1.005 - 1.030   pH 6.5  5.0 - 8.0   Glucose, UA NEGATIVE  NEGATIVE mg/dL   Hgb urine dipstick NEGATIVE  NEGATIVE   Bilirubin Urine NEGATIVE  NEGATIVE   Ketones, ur NEGATIVE  NEGATIVE mg/dL   Protein, ur NEGATIVE  NEGATIVE mg/dL   Urobilinogen, UA 0.2  0.0 - 1.0 mg/dL   Nitrite NEGATIVE  NEGATIVE   Leukocytes, UA SMALL (*) NEGATIVE  URINE MICROSCOPIC-ADD ON     Status: None   Collection Time    08/02/14  4:38 AM      Result Value Ref Range   Squamous Epithelial / LPF RARE  RARE   WBC, UA 11-20  <3 WBC/hpf   Bacteria, UA RARE  RARE  GLUCOSE, CAPILLARY     Status: Abnormal   Collection Time    08/02/14  6:37 AM      Result Value Ref Range   Glucose-Capillary 65 (*) 70 - 99 mg/dL     HEENT: normal Cardio: RRR and no murmur Resp: CTA B/L and unlabored GI: BS  positive and NT Extremity:  Pulses positive and No Edema Skin:   Intact and Other dry Neuro: Alert/Oriented, Normal Sensory, Abnormal Motor 3- R delt, bi, tri grip, 2-/5 RLE HF, 3- KE, trace at anklet Reflexes: 3+ Musc/Skel:mild groin pain with R hip ROM Gen NAD   Assessment/Plan: 1. 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 Physiatrist is providing close team supervision and 24 hour management of active medical problems listed below. Physiatrist and rehab team continue to assess barriers to discharge/monitor patient progress toward functional and medical goals. Team conference today please see physician documentation under team conference tab, met with team face-to-face to discuss problems,progress, and goals. Formulized individual treatment plan based on medical history, underlying problem and comorbidities. FIM: FIM - Bathing Bathing Steps Patient Completed: Chest;Right Arm;Abdomen;Front perineal area;Right upper leg;Left upper leg;Left lower leg (including foot);Buttocks;Right lower leg (including foot) Bathing: 4: Min-Patient completes 8-9 [id number] parts or 75+ percent  FIM - Upper Body Dressing/Undressing Upper body dressing/undressing steps patient completed: Put head through opening of pull over shirt/dress;Thread/unthread left sleeve of pullover shirt/dress;Pull shirt over trunk;Thread/unthread right sleeve of pullover shirt/dresss Upper body dressing/undressing: 5: Supervision: Safety issues/verbal cues FIM - Lower Body Dressing/Undressing Lower body dressing/undressing steps patient completed: Thread/unthread right pants leg;Thread/unthread left pants leg;Thread/unthread right underwear leg;Pull underwear up/down;Pull pants up/down Lower body dressing/undressing: 3: Mod-Patient completed 50-74% of tasks  FIM - Toileting Toileting steps  completed by patient: Performs perineal hygiene;Adjust clothing after toileting;Adjust clothing prior to toileting Toileting Assistive Devices: Grab bar or rail for support Toileting: 4: Steadying assist  FIM - TRadio producerDevices: Bedside commode Toilet Transfers: 3-From toilet/BSC: Mod A (lift or lower assist);3-To toilet/BSC: Mod A (lift or lower assist)  FIM - Bed/Chair Transfer Bed/Chair Transfer Assistive Devices: Arm rests;Bed rails Bed/Chair Transfer: 5: Supine > Sit: Supervision (verbal cues/safety issues);5: Sit > Supine: Supervision (verbal cues/safety  issues);3: Bed > Chair or W/C: Mod A (lift or lower assist);2: Chair or W/C > Bed: Max A (lift and lower assist)  FIM - Locomotion: Wheelchair Distance: 200 Locomotion: Wheelchair: 5: Travels 150 ft or more: maneuvers on rugs and over door sills with supervision, cueing or coaxing FIM - Locomotion: Ambulation Locomotion: Ambulation Assistive Devices: Museum/gallery curator Ambulation/Gait Assistance: 1: +2 Total assist Locomotion: Ambulation: 1: Two helpers  Comprehension Comprehension Mode: Auditory Comprehension: 5-Understands complex 90% of the time/Cues < 10% of the time  Expression Expression Mode: Verbal Expression: 5-Expresses complex 90% of the time/cues < 10% of the time  Social Interaction Social Interaction: 5-Interacts appropriately 90% of the time - Needs monitoring or encouragement for participation or interaction.  Problem Solving Problem Solving: 5-Solves basic problems: With no assist  Memory Memory: 2-Recognizes or recalls 25 - 49% of the time/requires cueing 51 - 75% of the time   1. Functional deficits secondary to left CVA with right-sided weakness  2. DVT Prophylaxis/Anticoagulation: SCDs. Monitor for any signs of DVT  3. Pain Management: Tylenol as needed , R groin pain likely from hip, 9/19 R hip Xray,showing mod OA 4. Hypertension. Amlodipine 10 mg daily, lisinopril 40  mg daily, metoprolol 50 mg daily, hydrochlorothiazide 12.5 mg daily.BP on low side, elevated BUN will hold HCTZ Monitor with increased mobility  5. Neuropsych: This patient is capable of making decisions on her own behalf.  6. Skin/Wound Care: Routine skin checks  7. Diabetes mellitus with peripheral neuropathy, had bilateral foot pain early this am, trial gabapentin qhs. reduce Lantus insulin  slightly secondary to low am CBG. Check blood sugars a.c. and at bedtime  8. Hyperlipidemia. Atorvastatin 80 mg daily  9. Urinary tract infection ? Recurrence, low grade temp x 1. Cipro completed,reculture pending UA equivocal   LOS (Days) 19 A FACE TO FACE EVALUATION WAS PERFORMED    KIRSTEINS,ANDREW E 08/02/2014, 8:42 AM

## 2014-08-02 NOTE — Patient Care Conference (Signed)
Inpatient RehabilitationTeam Conference and Plan of Care Update Date: 08/02/2014   Time: 10;30 AM    Patient Name: Megan Moore      Medical Record Number: [id number]  Date of Birth: 1940/12/06 Sex: Female         Room/Bed: 4W04C/4W04C-01 Payor Info: Payor: [employer] MEDICARE / Plan: [employer] MEDICARE CHOICE PPO / Product Type: *No Product type* /    Admitting Diagnosis: L CVA  Admit Date/Time:  07/14/2014  3:30 PM Admission Comments: No comment available   Primary Diagnosis:  <principal problem not specified> Principal Problem: <principal problem not specified>  Patient Active Problem List   Diagnosis Date Noted  . CVA (cerebral infarction) 07/14/2014    Expected Discharge Date: Expected Discharge Date: 08/04/14  Team Members Present: Physician leading conference: Dr. Claudette LawsAndrew Kirsteins Social Worker Present: Dossie DerBecky Emeterio Balke, LCSW Nurse Present: Carmie EndAngie Joyce, RN PT Present: Edman CircleAudra Hall, PT;Cole Eulogio DitchKampen, PT;Blair Hobble, PT OT Present: Rosalio LoudSarah Hoxie, OT SLP Present: Jackalyn LombardNicole Page, SLP PPS Coordinator present : Tora DuckMarie Noel, RN, CRRN     Current Status/Progress Goal Weekly Team Focus  Medical   low am CBG, symptomatic  maintain me  D/C planning   Bowel/Bladder   Continent of bowel; LBM 07/30/14, incontinent of bladder at times with briefs in use  Manage bladder with briefs and timed toileting; Min Assist  Timed toileting q2hr   Swallow/Nutrition/ Hydration     [facility]        ADL's   set up UB dressing, min A bathing, mod A toileting and LB dressing, mod A BSC transfers, improving use of RUE  Patient progressing toward some of her long term goals, other goals have been modified.  Plan of care revisions: LTGs of toileting, toilet transfers, and LB dressing modified to mod A (versus min A). 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  ADL retraining, NDT, LUE neuro re-ed, functional mobilty,  pt/family educattion   Mobility   S bed mobility, Mod/max A transfers, S sitting balance, S WC prop, +2 gait and stairs  Mod A transfers, S WC propulsion, S bed mobility   Family training, transfer training, DC planning    Communication     [facility]        Safety/Cognition/ Behavioral Observations    no unsafe behaviors        Pain   Denies  </=3      Skin   No skin issues at this time  No new skin breakdown  Assess skin qshift      *See Care Plan and progress notes for long and short-term goals.  Barriers to Discharge: 6 step    Possible Resolutions to Barriers:  ramp to be built, delayed by rain    Discharge Planning/Teaching Needs:  Family education ongoing this week, completing ramp for home. Should be ready Friday for discharge      Team Discussion:  Family education on-going.  Baseline cognition-SP discharged.  UA checked-negative. Low BS adjusting lantus.  Weather has impeded ramp being completed, hopeful today can get a lot done.  Revisions to Treatment Plan:  None as long as ramp completed weather has been an issue   Continued Need for Acute Rehabilitation Level of Care: The patient requires daily medical management by a physician with specialized training in physical medicine and rehabilitation for the following conditions: Daily direction of a multidisciplinary physical rehabilitation program to ensure safe treatment  while eliciting the highest outcome that is of practical value to the patient.: Yes Daily medical management of patient stability for increased activity during participation in an intensive rehabilitation regime.: Yes Daily analysis of laboratory values and/or radiology reports with any subsequent need for medication adjustment of medical intervention for : Neurological problems  Mahagony Grieb, Lemar Livings 08/02/2014, 1:53 PM

## 2014-08-02 NOTE — Progress Notes (Signed)
Physical Therapy Session Note  Patient Details  Name: Megan Moore MRN: [id number] Date of Birth: 11/10/1940  Today's Date: 08/02/2014 PT Individual Time: 1005-1035 and 14:15-15:15 PT Individual Time Calculation (min): 30 min  And 60 min   Short Term Goals: Week 3:  PT Short Term Goal 1 (Week 3): STG=LTG due to LOS  Skilled Therapeutic Interventions/Progress Updates:  Tx focused on family training for basic bed<>WC transfers. Discussed with OT, and advised pt/family to consider wearing pajama pants to bed to increase transfer safety in the mornings.   Therapeutic activity Initially attempted actual bed height, but pt unable to safely complete with Max A to R. Family was advised to remove box springs or switch matreses for lower height transfers. She was able to complete WC>bed Bobath transfers with Max A to R, Mod A to L. Pt/family continue to need max verbal cues for set-up, sequence, positioning, and timing. Attempted family taking instructional video, but afterwards discovered that it did not take and time not allow for additional attempt. Will reattempt video instruction for carry-over at home again.   Second tx focused on transfer training and NMR via forced use, manual facilitation, and multi-modal cues. Pt very fatigued at start of tx, wanting to go to bed, but agreeable to PT.   Pt propelled WC to gym with intermittent S only with increased speed today. Pt challenged to use bil LEs only for propulsion x50' with good RLE activation, needing S safety cues.   Performed WC<>Nustep, WC<>mat, WC<>toilet, and WC>bed transfers with Mod A overall this afternoon (Min A transfer to toilet with grab bar). Ensuring LLE was tucked under chair decreased retropulsion this afternoon, as well as manually facilitating anterior translation. Pt continues to need total A for set-up, step-by-step cues for sequence/technique, and safety cues for RUE management.   Sit<>supine performed at mat and bed with S  and cues for logrolling for optimal ending position.   Pt engaged in Nustep x116min level 4 with bil LEs and RUE only for increased coordination and motor control. R hand grip used, but pt able to produce grip with cues and was able to align RLE with cues as well.   Instructed pt in supine NMR HEP for lower trunk rotation, briding, and half-bridging with assist for RLE stability. Pt noted to have improved hip ext.   Pt left up in bed with alarm on and all needs in reach.        Therapy Documentation Precautions:  Precautions Precautions: Fall Precaution Comments: pt pushes toward hemiplegic side Restrictions Weight Bearing Restrictions: No   Vital Signs: Therapy Vitals Resp: 18 BP: 115/60 mmHg Patient Position (if appropriate): Sitting Oxygen Therapy SpO2: 100 % O2 Device: None (Room air) Pain: none  See FIM for current functional status  Therapy/Group: Individual Therapy Clydene Lamingole Rohn Fritsch, PT, DPT  08/02/2014, 10:52 AM

## 2014-08-02 NOTE — Progress Notes (Signed)
Occupational Therapy Session Note  Patient Details  Name: Megan Moore MRN: [id number] Date of Birth: 05-Apr-1941  Today's Date: 08/02/2014 OT Individual Time: 1330-1400 OT Individual Time Calculation (min): 30 min    Short Term Goals: Week 3:  OT Short Term Goal 1 (Week 3): STGs = LTGS  Skilled Therapeutic Interventions/Progress Updates:  Pt seated in wheelchair awaiting therapist arrival upon entering the room. Pt with no c/o pain this session only , "tired". Pt propelled wheelchair with  L UE and LE to tub room in order to practice tub transfer bench in simulated home environment with multiple rest breaks secondary to verbal cues. Pt required coaxing in order to continue to propel wheelchair. OT educated and demonstrated tub transfer to patient. Pt requiring additional verbal cues for set up of transfer with therapist utilizing squat pivot with Bobath technique w/c <>tub bench with Mod A. Pt able to lift R LE into tub with verbal cues but unable to assist R LE out of tub when transferring back to chair. Pt propelled self back to room and with call bell and all needed items within reach awaiting next therapist arrival.   Therapy Documentation Precautions:  Precautions Precautions: Fall Precaution Comments: pt pushes toward hemiplegic side Restrictions Weight Bearing Restrictions: No   See FIM for current functional status  Therapy/Group: Individual Therapy  Lowella Gripittman, Ahkeem Goede L 08/02/2014, 4:15 PM

## 2014-08-02 NOTE — Progress Notes (Signed)
Occupational Therapy Session Note  Patient Details  Name: Megan Moore MRN: [id number] Date of Birth: 08/02/1941  Today's Date: 08/02/2014 OT Individual Time: 0900-1000 OT Individual Time Calculation (min): 60 min    Short Term Goals: Week 1:  OT Short Term Goal 1 (Week 1): Pt will perform LB dressing with Max A in order to increase I with self care OT Short Term Goal 1 - Progress (Week 1): Met OT Short Term Goal 2 (Week 1): Pt will perform shower transfer with Mod A in order to increase I in functional transfers. OT Short Term Goal 2 - Progress (Week 1): Other (comment) (We have not tried shower transfers yet, due to decreased sitting balance.) OT Short Term Goal 3 (Week 1): Pt will perform toilet transfer with Mod A in order to increase I in functional transfers.  OT Short Term Goal 3 - Progress (Week 1): Met OT Short Term Goal 4 (Week 1): Pt will perform toileting with Mod A in order to increase I with self care. OT Short Term Goal 4 - Progress (Week 1): Partly met OT Short Term Goal 5 (Week 1): Pt perform UB dressing with Min A in order to increase I in self care.  OT Short Term Goal 5 - Progress (Week 1): Met Week 2:  OT Short Term Goal 1 (Week 2): Pt will be able to transfer onto a tub bench in the walk in shower with mod A using grab bars and a squat pivot. OT Short Term Goal 1 - Progress (Week 2): Met OT Short Term Goal 2 (Week 2): Pt will be able to stand at the sink with min- mod A (versus mod-max A) to increase Independence with LB self care. OT Short Term Goal 2 - Progress (Week 2): Met OT Short Term Goal 3 (Week 2): Pt will demonstrate improved functional use of RUE to wash L arm with min A. OT Short Term Goal 3 - Progress (Week 2): Met OT Short Term Goal 4 (Week 2): Pt will be able to don pants over R foot and pull pants over her hips while being supported in standing. OT Short Term Goal 4 - Progress (Week 2): Met Week 3:  OT Short Term Goal 1 (Week 3): STGs =  LTGS  Skilled Therapeutic Interventions/Progress Updates:    Pt seen for BADL retraining to include shower and dressing with a focus on family education with spouse and sister. Husband assisted his wife with bed mobility. Pt did extremely well with supine to sit. She rolled fully onto R side with verbal cues, pushed her feet off the bed with verbal cues, and then pushed herself into sitting with S. No A needed at all, which is the first time she had accomplished that.  The bed was adjusted to meet her bed height at home, which is quite high. Her spouse assisted her from bed to w/c with max A including therapist assisting to guide her into the chair. Because of the bed height, pt is not able to position her feet well and she was not able to lean forward enough. Husband will need to use bobath technique due to his height versus hers. Informed her PT of this transfer difficulty and PT planned to continue to practice them with the patient.  Pt transferred onto tub bench in walk in shower to bathe demonstrating much improved RUE control in that she was able to maintain grip on washcloth and wash LUE fully without any HOH assist.  Pt  completed dressing from w/c with improved standing balance with support on her L side. Pt would stand without LUE support to pull pants up and min A support from caregiver.  Pt resting in w/c at end of session in room with her family.  Therapy Documentation Precautions:  Precautions Precautions: Fall Precaution Comments: pt pushes toward hemiplegic side Restrictions Weight Bearing Restrictions: No    Vital Signs: Therapy Vitals Resp: 18 BP: 115/60 mmHg Patient Position (if appropriate): Sitting Oxygen Therapy SpO2: 100 % O2 Device: None (Room air) Pain: Pain Assessment Pain Assessment: No/denies pain Pain Score: 0-No pain ADL:  See FIM for current functional status  Therapy/Group: Individual Therapy  Rimas Gilham 08/02/2014, 11:33 AM

## 2014-08-02 NOTE — Progress Notes (Signed)
Social Work Patient ID: Megan Moore, female   DOB: [birth year], 73 y.o.   MRN: [id number] Met with pt, sister and husband to discuss team conference progression toward goals and the on-going family education.  The weather has impeded the progress with building the ramp at home, with all of This rain.  Hopeful today can get much done with clear weather.  Agreeable to DME and follow up. Will work toward discharge on Friday.

## 2014-08-02 NOTE — Progress Notes (Signed)
Patient's CBG this morning was 65 and patient is scheduled to receive lantus.  Deatra Inaan Angiulli, PA notified and ordered to hold today's dose of lantus.  Will continue to monitor.

## 2014-08-02 NOTE — Progress Notes (Signed)
Inpatient Diabetes Program Recommendations  AACE/ADA: New Consensus Statement on Inpatient Glycemic Control (2013)  Target Ranges:  Prepandial:   less than 140 mg/dL      Peak postprandial:   less than 180 mg/dL (1-2 hours)      Critically ill patients:  140 - 180 mg/dL   Noted hypoglycemia events primarily in am. Would recommend a decrease in the basal per below: Inpatient Diabetes Program Recommendations Insulin - Basal: would not discontinue the basal levemir altogether. May want to reduce to 10 units and increase if/as needed. Thank you, Lenor CoffinAnn Dewie Ahart, RN, CNS, Diabetes Coordinator [phone])

## 2014-08-03 ENCOUNTER — Inpatient Hospital Stay (HOSPITAL_COMMUNITY): Payer: Medicare PPO

## 2014-08-03 ENCOUNTER — Inpatient Hospital Stay (HOSPITAL_COMMUNITY): Payer: Medicare PPO | Admitting: Occupational Therapy

## 2014-08-03 DIAGNOSIS — I63512 Cerebral infarction due to unspecified occlusion or stenosis of left middle cerebral artery: Secondary | ICD-10-CM

## 2014-08-03 DIAGNOSIS — E1140 Type 2 diabetes mellitus with diabetic neuropathy, unspecified: Secondary | ICD-10-CM

## 2014-08-03 LAB — GLUCOSE, CAPILLARY
GLUCOSE-CAPILLARY: 129 mg/dL — AB (ref 70–99)
Glucose-Capillary: 89 mg/dL (ref 70–99)
Glucose-Capillary: 131 mg/dL — ABNORMAL HIGH (ref 70–99)
Glucose-Capillary: 99 mg/dL (ref 70–99)

## 2014-08-03 LAB — URINE CULTURE: Colony Count: 50000

## 2014-08-03 NOTE — Discharge Summary (Signed)
Megan Moore, Megan Moore                ACCOUNT NO.:  [id number]  MEDICAL RECORD NO.:  [id number]  LOCATION:  [id number]                        FACILITY:  [facility]  PHYSICIAN:  Erick Colace, M.D.DATE OF BIRTH:  Feb 03, 1941  DATE OF ADMISSION:  07/14/2014 DATE OF DISCHARGE:  08/04/2014                              DISCHARGE SUMMARY   DISCHARGE DIAGNOSES: 1. 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 2. Sequential compression devices for deep venous thrombosis     prophylaxis. 3. Hypertension. 4. Diabetes mellitus with peripheral neuropathy. 5. Hyperlipidemia. 6. Urinary tract infection, resolved.  HISTORY OF PRESENT ILLNESS:  This is a 73 year old right-handed female with history of hypertension, diabetes mellitus, who presented to [facility] on July 12, 2014, with acute onset of right-sided weakness.  Cranial CT scan negative.  MRI of the brain showed acute infarct, left-sided internal capsule as well as remote lacunar infarct of the thalami bilaterally, the left caudate head and posterior right pons.  The patient did not receive tPA.  Echocardiogram unremarkable.  Carotid Dopplers with no ICA stenosis.  Neurology consulted, placed on aspirin therapy for CVA prophylaxis.  The patient was completing a course of Cipro for urinary tract infection.  Physical and occupational therapy ongoing.  The patient was admitted for comprehensive rehab program.  PAST MEDICAL HISTORY:  See discharge diagnoses.  SOCIAL HISTORY:  Married, one-level home.  FUNCTIONAL HISTORY:  Prior to admission, independent.  FUNCTIONAL STATUS:  Upon admission to [facility], was mod-to-max assist due to right-sided weakness, max assist activities of daily living.  PHYSICAL EXAMINATION:  VITAL SIGNS:  Blood pressure 128/70, pulse 80, respirations 18, temperature 98.6. GENERAL:  This was an alert female, oriented x3. HEENT:  Pupils were round and  reactive to light. LUNGS:  Clear to auscultation. CARDIAC:  Regular rate and rhythm. ABDOMEN:  Soft, nontender.  Good bowel sounds. NEUROLOGIC:  She followed simple commands, maybe perhaps some subtle higher cognitive thinking issues, mildly dysarthric, but fully intelligible.  REHABILITATION HOSPITAL COURSE:  The patient was admitted to Inpatient Rehab Services with therapies initiated on a 3-hour daily basis consisting of physical therapy, occupational therapy, and rehabilitation nursing.  The following issues were addressed during the patient's rehabilitation stay.  Pertaining to [name]' left CVA with right- sided weakness remained stable, maintained on aspirin therapy. Sequential compression devices for DVT prophylaxis.  Blood pressures controlled with Norvasc as well as lisinopril and metoprolol with no orthostatic changes.  She did have a history of diabetes mellitus with peripheral neuropathy.  She remained on insulin therapy, full diabetic teaching ongoing.  She will continue Lipitor for hyperlipidemia.  She had completed a course of Cipro for urinary tract infection, denying any dysuria or hematuria.  The patient received weekly collaborative interdisciplinary team conferences to discuss estimated length of stay, family teaching, any barriers to her discharge, working with transfer training, needing some cues at times, squat pivot transfers, moderate assist; mat to wheelchair with right, min assist.  Strength and endurance continued to improve, mod-to-max assist, squat pivot transfers to the left, activities of daily living.  The patient was able to roll onto her right without  assistance, only needing minimal assist to sit from side lying.  Full family teaching was completed with her husband. Plan was to be discharged to home with home health physical and occupational therapy.  DISCHARGE MEDICATIONS: 1. Norvasc 10 mg p.o. daily. 2. Aspirin 81 mg p.o. daily. 3. Lipitor  80 mg p.o. daily. 4. Neurontin 300 mg p.o. at bedtime. 5. Lantus insulin 20 units subcutaneously daily. 6. Lisinopril 40 mg p.o. daily. 7. Metoprolol 50 mg p.o. daily. 8. Ditropan 5 mg p.o. b.i.d.  DIET:  Diabetic diet.  SPECIAL INSTRUCTIONS:  The patient would follow up with Dr. Claudette LawsAndrew Kirsteins [facility] on August 29, 2014, follow up PCP with case manager to arrange.     Megan Moore, P.A.   ______________________________ Erick ColaceAndrew E. Kirsteins, M.D.    DA/MEDQ  D:  08/03/2014  T:  08/03/2014  Job:  [id number]

## 2014-08-03 NOTE — Discharge Summary (Signed)
Discharge summary job # [id number]

## 2014-08-03 NOTE — Progress Notes (Signed)
Social Work Patient ID: Megan Moore, female   DOB: [birth year], 73 y.o.   MRN: [id number] Met with pt and sister to discuss rental ultra hem wheelchair would be 173.00 per month.  They want to wait and see if the chair they are getting will work. They can always get other wheelchair if once home they decide to change their minds.  Will work toward discharge tomorrow.  Ramp will not be done until next Sat, so son and Son in-law plan to carry pt up the stairs at home.

## 2014-08-03 NOTE — Progress Notes (Signed)
Subjective/Complaints: 73 year old right-handed female with history of hypertension as well as diabetes mellitus with peripheral neuropathy. Presented to [facility] 07/12/2014 with acute onset of right-sided weakness. Cranial CT scan negative. MRI of the brain showed acute infarct left side of the internal capsule as well as remote lacunar infarct of the thalami bilaterally, the left caudate head and posterior right pons. Patient did not receive TPA. Echocardiogram unremarkable. Carotid Dopplers with no ICA stenosis. Neurology services consulted placed on aspirin for CVA prophylaxis.   Denies any issues last night. Anxious to get home.  Review of Systems - Negative except R side weak  Objective: Vital Signs: Blood pressure 142/68, pulse 67, temperature 98.3 F (36.8 C), temperature source Oral, resp. rate 18, weight 60.102 kg (132 lb 8 oz), SpO2 100.00%. No results found. Results for orders placed during the hospital encounter of 07/14/14 (from the past 72 hour(s))  GLUCOSE, CAPILLARY     Status: None   Collection Time    07/31/14 11:27 AM      Result Value Ref Range   Glucose-Capillary 97  70 - 99 mg/dL  GLUCOSE, CAPILLARY     Status: Abnormal   Collection Time    07/31/14  4:32 PM      Result Value Ref Range   Glucose-Capillary 118 (*) 70 - 99 mg/dL   Comment 1 Notify RN    GLUCOSE, CAPILLARY     Status: Abnormal   Collection Time    07/31/14  8:58 PM      Result Value Ref Range   Glucose-Capillary 107 (*) 70 - 99 mg/dL  GLUCOSE, CAPILLARY     Status: None   Collection Time    08/01/14  7:33 AM      Result Value Ref Range   Glucose-Capillary 81  70 - 99 mg/dL   Comment 1 Notify RN    GLUCOSE, CAPILLARY     Status: Abnormal   Collection Time    08/01/14 11:28 AM      Result Value Ref Range   Glucose-Capillary 129 (*) 70 - 99 mg/dL   Comment 1 Notify RN    GLUCOSE, CAPILLARY     Status: None   Collection Time    08/01/14  4:37 PM      Result Value  Ref Range   Glucose-Capillary 99  70 - 99 mg/dL   Comment 1 Notify RN    GLUCOSE, CAPILLARY     Status: Abnormal   Collection Time    08/01/14  9:08 PM      Result Value Ref Range   Glucose-Capillary 116 (*) 70 - 99 mg/dL  URINALYSIS, ROUTINE W REFLEX MICROSCOPIC     Status: Abnormal   Collection Time    08/02/14  4:38 AM      Result Value Ref Range   Color, Urine YELLOW  YELLOW   APPearance CLEAR  CLEAR   Specific Gravity, Urine 1.015  1.005 - 1.030   pH 6.5  5.0 - 8.0   Glucose, UA NEGATIVE  NEGATIVE mg/dL   Hgb urine dipstick NEGATIVE  NEGATIVE   Bilirubin Urine NEGATIVE  NEGATIVE   Ketones, ur NEGATIVE  NEGATIVE mg/dL   Protein, ur NEGATIVE  NEGATIVE mg/dL   Urobilinogen, UA 0.2  0.0 - 1.0 mg/dL   Nitrite NEGATIVE  NEGATIVE   Leukocytes, UA SMALL (*) NEGATIVE  URINE MICROSCOPIC-ADD ON     Status: None   Collection Time    08/02/14  4:38 AM  Result Value Ref Range   Squamous Epithelial / LPF RARE  RARE   WBC, UA 11-20  <3 WBC/hpf   Bacteria, UA RARE  RARE  GLUCOSE, CAPILLARY     Status: Abnormal   Collection Time    08/02/14  6:37 AM      Result Value Ref Range   Glucose-Capillary 65 (*) 70 - 99 mg/dL  GLUCOSE, CAPILLARY     Status: None   Collection Time    08/02/14  7:02 AM      Result Value Ref Range   Glucose-Capillary 93  70 - 99 mg/dL  GLUCOSE, CAPILLARY     Status: None   Collection Time    08/02/14 11:43 AM      Result Value Ref Range   Glucose-Capillary 97  70 - 99 mg/dL   Comment 1 Notify RN    GLUCOSE, CAPILLARY     Status: Abnormal   Collection Time    08/02/14  4:25 PM      Result Value Ref Range   Glucose-Capillary 123 (*) 70 - 99 mg/dL   Comment 1 Notify RN    GLUCOSE, CAPILLARY     Status: Abnormal   Collection Time    08/02/14  9:17 PM      Result Value Ref Range   Glucose-Capillary 142 (*) 70 - 99 mg/dL  GLUCOSE, CAPILLARY     Status: None   Collection Time    08/03/14  6:41 AM      Result Value Ref Range   Glucose-Capillary 89   70 - 99 mg/dL     HEENT: normal Cardio: RRR and no murmur Resp: CTA B/L and unlabored GI: BS positive and NT Extremity:  Pulses positive and No Edema Skin:   Intact and Other dry Neuro: Alert/Oriented, good insight. Normal Sensory, Abnormal Motor 3- R delt, bi, tri grip, 2/5 RLE HF, 3-KE, trace at anklet Reflexes: 3+ Musc/Skel:mild groin pain with R hip ROM Gen NAD   Assessment/Plan: 1. 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 Physiatrist is providing close team supervision and 24 hour management of active medical problems listed below. Physiatrist and rehab team continue to assess barriers to discharge/monitor patient progress toward functional and medical goals. Team conference today please see physician documentation under team conference tab, met with team face-to-face to discuss problems,progress, and goals. Formulized individual treatment plan based on medical history, underlying problem and comorbidities. FIM: FIM - Bathing Bathing Steps Patient Completed: Chest;Right Arm;Abdomen;Front perineal area;Right upper leg;Left upper leg;Left lower leg (including foot);Buttocks;Right lower leg (including foot);Left Arm Bathing: 4: Steadying assist  FIM - Upper Body Dressing/Undressing Upper body dressing/undressing steps patient completed: Put head through opening of pull over shirt/dress;Thread/unthread left sleeve of pullover shirt/dress;Pull shirt over trunk;Thread/unthread right sleeve of pullover shirt/dresss Upper body dressing/undressing: 5: Supervision: Safety issues/verbal cues FIM - Lower Body Dressing/Undressing Lower body dressing/undressing steps patient completed: Thread/unthread right pants leg;Thread/unthread left pants leg;Thread/unthread right underwear leg;Pull underwear up/down;Pull pants up/down;Thread/unthread left  underwear leg Lower body dressing/undressing: 3: Mod-Patient completed 50-74% of tasks  FIM - Toileting Toileting steps completed by patient: Performs perineal hygiene;Adjust clothing after toileting;Adjust clothing prior to toileting Toileting Assistive Devices: Grab bar or rail for support Toileting: 4: Steadying assist  FIM - Diplomatic Services operational officer Devices: Grab bars Toilet Transfers: 4-To toilet/BSC: Min A (steadying Pt. > 75%);4-From toilet/BSC: Min  A (steadying Pt. > 75%)  FIM - Bed/Chair Transfer Bed/Chair Transfer Assistive Devices: Arm rests;Orthosis Bed/Chair Transfer: 5: Supine > Sit: Supervision (verbal cues/safety issues);5: Sit > Supine: Supervision (verbal cues/safety issues);3: Bed > Chair or W/C: Mod A (lift or lower assist);3: Chair or W/C > Bed: Mod A (lift or lower assist)  FIM - Locomotion: Wheelchair Distance: 200 Locomotion: Wheelchair: 5: Travels 150 ft or more: maneuvers on rugs and over door sills with supervision, cueing or coaxing FIM - Locomotion: Ambulation Locomotion: Ambulation Assistive Devices: Museum/gallery curator Ambulation/Gait Assistance: 1: +2 Total assist Locomotion: Ambulation: 0: Activity did not occur (Too fatigued to complete safely today)  Comprehension Comprehension Mode: Auditory Comprehension: 5-Understands complex 90% of the time/Cues < 10% of the time  Expression Expression Mode: Verbal Expression: 5-Expresses complex 90% of the time/cues < 10% of the time  Social Interaction Social Interaction: 5-Interacts appropriately 90% of the time - Needs monitoring or encouragement for participation or interaction.  Problem Solving Problem Solving: 5-Solves basic problems: With no assist  Memory Memory: 2-Recognizes or recalls 25 - 49% of the time/requires cueing 51 - 75% of the time   1. Functional deficits secondary to left CVA with right-sided weakness  2. DVT Prophylaxis/Anticoagulation: SCDs. Monitor for any signs  of DVT  3. Pain Management: Tylenol as needed , R groin pain likely from hip, 9/19 R hip Xray,showing mod OA 4. Hypertension. Amlodipine 10 mg daily, lisinopril 40 mg daily, metoprolol 50 mg daily, hydrochlorothiazide 12.5 mg daily.BP on low side, elevated BUN will hold HCTZ Monitor with increased mobility  5. Neuropsych: This patient is capable of making decisions on her own behalf.   6. Skin/Wound Care: Routine skin checks  7. Diabetes mellitus with peripheral neuropathy,  trial gabapentin qhs.  - reduced Lantus insulin  slightly secondary to low am CBG---may need further taper.   8. Hyperlipidemia. Atorvastatin 80 mg daily  9. Urinary tract infection ? Recurrence, low grade temp x 1. Cipro completed,reculture still  pending      LOS (Days) 20 A FACE TO FACE EVALUATION WAS PERFORMED    Barb Shear T 08/03/2014, 9:45 AM

## 2014-08-03 NOTE — Progress Notes (Signed)
Occupational Therapy Session Note  Patient Details  Name: Megan Moore MRN: [id number] Date of Birth: 1941/01/29  Today's Date: 08/03/2014 OT Individual Time: 0904-1003 OT Individual Time Calculation (min): 59 min    Short Term Goals: Week 1:  OT Short Term Goal 1 (Week 1): Pt will perform LB dressing with Max A in order to increase I with self care OT Short Term Goal 1 - Progress (Week 1): Met OT Short Term Goal 2 (Week 1): Pt will perform shower transfer with Mod A in order to increase I in functional transfers. OT Short Term Goal 2 - Progress (Week 1): Other (comment) (We have not tried shower transfers yet, due to decreased sitting balance.) OT Short Term Goal 3 (Week 1): Pt will perform toilet transfer with Mod A in order to increase I in functional transfers.  OT Short Term Goal 3 - Progress (Week 1): Met OT Short Term Goal 4 (Week 1): Pt will perform toileting with Mod A in order to increase I with self care. OT Short Term Goal 4 - Progress (Week 1): Partly met OT Short Term Goal 5 (Week 1): Pt perform UB dressing with Min A in order to increase I in self care.  OT Short Term Goal 5 - Progress (Week 1): Met Week 2:  OT Short Term Goal 1 (Week 2): Pt will be able to transfer onto a tub bench in the walk in shower with mod A using grab bars and a squat pivot. OT Short Term Goal 1 - Progress (Week 2): Met OT Short Term Goal 2 (Week 2): Pt will be able to stand at the sink with min- mod A (versus mod-max A) to increase Independence with LB self care. OT Short Term Goal 2 - Progress (Week 2): Met OT Short Term Goal 3 (Week 2): Pt will demonstrate improved functional use of RUE to wash L arm with min A. OT Short Term Goal 3 - Progress (Week 2): Met OT Short Term Goal 4 (Week 2): Pt will be able to don pants over R foot and pull pants over her hips while being supported in standing. OT Short Term Goal 4 - Progress (Week 2): Met Week 3:  OT Short Term Goal 1 (Week 3): STGs =  LTGS  Skilled Therapeutic Interventions/Progress Updates:      Pt seen for BADL retraining of bathing and dressing with a focus on use of RUE and standing balance with postural control. Pt received in bed. She was not able to remember what she worked on during [employer] afternoon OT session. [name] spouse called with bathroom door measurements. It will be too narrow for a w/c to fit.  Therefore, we will not persue use of tub  Bench and defer that assessment to the HHOT.    Pt was able to roll onto her R without assist, and only needed min A to sit from sidelying. Pt transferred with Bobath technique to chair with mod A. Pt then completed B/D from chair. She needed mod A at times to stabilize her balance as she pulled pants over hips. Pt continually stated she was very tired and c/o dizziness after standing. BP, O2, HR all WNL. Pt was then taken to the gym to work on a few minutes of RUE grasping and reaching exercises. Her PT arrived for the next session.  Therapy Documentation Precautions:  Precautions Precautions: Fall Precaution Comments: pt pushes toward hemiplegic side Restrictions Weight Bearing Restrictions: No    Vital Signs: Therapy Vitals  BP: 142/68 mmHg Pain: Pain Assessment Pain Assessment: No/denies pain ADL:  See FIM for current functional status  Therapy/Group: Individual Therapy  Clay 08/03/2014, 11:35 AM

## 2014-08-03 NOTE — Progress Notes (Addendum)
Physical Therapy Session Note  Patient Details  Name: Megan Moore MRN: [id number] Date of Birth: 24-Aug-1941  Today's Date: 08/03/2014 PT Individual Time: 1000-1105; 1400-1500 PT Individual Time Calculation (min): 65 min , 60 min  Short Term Goals: Week 3:  PT Short Term Goal 1 (Week 3): STG=LTG due to LOS  Skilled Therapeutic Interventions/Progress Updates:  Tx 1:  OT reported that pt's bed height is 29", per husband.  His BR door measurement was 10" which must be incorrect. Ramp will not be completed until Oct 10, so family will have to bump w/c up 6 steps with 1 railing.  Pt stated her son and another young man should be able to get her into house.  She and sister stated she has a regular height bed which she could use instead, entering with bed on her L.    Husband not here for family ed; thought he was supposed to stay home for equipment delivery; clarified with CSW. CSW reported that pt's supplementary insurance will not pay for ultra hemi ht w/c; lowest height w/c they will pay for is 18" high.  [name] sister stated the family will try to rent an ultra height w/c for her, so that pt can be independent in w/c at home. PT referred sister to CSW.  Transfer training : pt able to remember 4/5 steps to transfer.  Multi-modal cues to scoot R hip out ot edge of w/c seat. When attempting squat pivot transfer, pt leaned backwards and transfer was terminated x 2; on 3rd try, pt was able to shift her wt forward better, and performed it with mod assist. Further transfer training with scooting L><R on high mat, and lifting bottom via forward wt shift in sitting on mat with wedge under bottom. Sit>< supine with supervision, VCs for supine> sit as pt does not attend to R LE position.  Mat> w/c to R with min assist, downhill, pt placing L hand on L armrest and scooting L.  neuromuscular re-education via forced use, visual feedback, manual cues for bil bridging, R unilalateral bridging, bil hip add/abd,  lower trunk rotation, all in hook lying position 10 x 1 each. Gaze stabilization exs in sitting, lateral head turns until pt c/o feeling "really drunk", resumed shortly when she stated she felt a "little drunk" .  Greater difficulty turning head R while eyes stable on target.  Tx 2:  Simulated car transfer to sedan height, x 2, +2 assist for a second person to stabilize door as pt used it to pull up on.  To enter, pt used L hand on edge of seat to pull up on, squat pivot with mod/max assist. To exit, pt used L hand on door handle as she pivoted around to R, mod assist.  neuromuscular re-education as above for R stance stability during pre-gait activities in standing, gait using L railing in hallway with mod/max assist, and up/down 3 (5") steps ascending step-to with L foot leading and descending step to with R foot leading, bil railings, mod assist, max VCS.  Up/down 2(7") steps sideways, L rail only with max assist. Sideways stepping with bil hands on rail, focusing on R hip abduction, R stance stability.   Pt exhausted, requested getting back to bed.  Mod/max assist squat pivot to L, with L hand placed far forward on bed. Sit> supine with supervision; scooting to [facility] with min assist to stabilize R foot.  Plan to practice steps using R railing, with pt turned sideways to R.  Therapy Documentation Precautions:  Precautions Precautions: Fall Precaution Comments: pt pushes toward hemiplegic side Restrictions Weight Bearing Restrictions: No Pain: Pain Assessment Pain Assessment: No/denies pain      See FIM for current functional status  Therapy/Group: Individual Therapy  Megan Moore 08/03/2014, 12:25 PM

## 2014-08-04 ENCOUNTER — Ambulatory Visit (HOSPITAL_COMMUNITY): Payer: Medicare PPO

## 2014-08-04 ENCOUNTER — Inpatient Hospital Stay (HOSPITAL_COMMUNITY): Payer: Medicare PPO | Admitting: Occupational Therapy

## 2014-08-04 LAB — GLUCOSE, CAPILLARY
GLUCOSE-CAPILLARY: 76 mg/dL (ref 70–99)
GLUCOSE-CAPILLARY: 91 mg/dL (ref 70–99)

## 2014-08-04 MED ORDER — LISINOPRIL 40 MG PO TABS: 40.0000 mg | ORAL_TABLET | ORAL | Status: DC | PRN

## 2014-08-04 MED ORDER — LISINOPRIL 40 MG PO TABS: 40.0000 mg | ORAL_TABLET | Freq: Every day | ORAL | Status: AC

## 2014-08-04 MED ORDER — ASPIRIN 81 MG PO CHEW: 81.0000 mg | CHEWABLE_TABLET | Freq: Every day | ORAL | Status: AC

## 2014-08-04 MED ORDER — ATORVASTATIN CALCIUM 80 MG PO TABS: 80.0000 mg | ORAL_TABLET | Freq: Every day | ORAL | Status: AC

## 2014-08-04 MED ORDER — GABAPENTIN 300 MG PO CAPS: 300.0000 mg | ORAL_CAPSULE | Freq: Every day | ORAL | Status: DC

## 2014-08-04 MED ORDER — INSULIN GLARGINE 100 UNIT/ML SOLOSTAR PEN: 20.0000 [IU] | PEN_INJECTOR | Freq: Every day | SUBCUTANEOUS | Status: AC

## 2014-08-04 MED ORDER — SENNOSIDES-DOCUSATE SODIUM 8.6-50 MG PO TABS: 2.0000 | ORAL_TABLET | Freq: Every day | ORAL | Status: AC

## 2014-08-04 MED ORDER — OXYBUTYNIN CHLORIDE 5 MG PO TABS: 5.0000 mg | ORAL_TABLET | Freq: Two times a day (BID) | ORAL | Status: AC

## 2014-08-04 MED ORDER — AMLODIPINE BESYLATE 10 MG PO TABS: 10.0000 mg | ORAL_TABLET | Freq: Every day | ORAL | Status: AC

## 2014-08-04 MED ORDER — METOPROLOL SUCCINATE ER 50 MG PO TB24: 50.0000 mg | ORAL_TABLET | Freq: Every day | ORAL | Status: AC

## 2014-08-04 NOTE — Progress Notes (Signed)
Occupational Therapy Discharge Summary  Patient Details  Name: Megan Moore MRN: [id number] Date of Birth: 01/29/1941   Patient has met 5 of 10 long term goals due to improved activity tolerance, improved balance, postural control, ability to compensate for deficits, functional use of  RIGHT upper and RIGHT lower extremity, improved attention, improved awareness and improved coordination.  Patient to discharge at overall min to   Mod Assist level.  Patient's care partner is independent to provide the necessary physical and cognitive assistance at discharge.   [name] spouse assisted her with bed mobility, bed to w/c, w/c to toilet, w/c to [facility]. The son also worked on [facility] transfers. Her spouse was able to recall steps of safe transfer and perform the transfers safely using a bobath technique by supporting her at the buttocks (without clothing) or with pants on.  Reasons goals not met: n/a  Recommendation:  Patient will benefit from ongoing skilled OT services in home health setting to continue to advance functional skills in the area of BADL.  Equipment: drop arm BSC  Reasons for discharge: treatment goals met  Patient/family agrees with progress made and goals achieved: Yes  OT Discharge ADL ADL ADL Comments: refer to FIM Vision/Perception  Vision- History Baseline Vision/History: Wears glasses Wears Glasses: At all times Perception Comments: She has made a great deal of progress, but continues to need cues to attend to R foot during transfers. Praxis Praxis-Other Comments: Pt has made progress in this area. She continues to have some motor planning deficits with her transfers.  Cognition Overall Cognitive Status: Within Functional Limits for tasks assessed Sensation Sensation Light Touch Impaired Details: Impaired RUE;Impaired RLE Stereognosis: Impaired by gross assessment Hot/Cold: Impaired by gross assessment Hot/Cold Impaired Details: Impaired RUE;Impaired  RLE Proprioception: Impaired Detail Proprioception Impaired Details: Impaired RUE;Impaired RLE Coordination Gross Motor Movements are Fluid and Coordinated: No Fine Motor Movements are Fluid and Coordinated: No Finger Nose Finger Test: Pt can now reach her R hand to her chin, but not her nose. Motor  Motor Motor: Hemiplegia Motor - Discharge Observations: improving RUE/RLE strength. Pt has gained a great deal of AROM in her RUE. Mobility    refer to FIM Trunk/Postural Assessment  Cervical Assessment Cervical Assessment: Within Functional Limits Thoracic Assessment Thoracic Assessment:  (kyphotic) Lumbar Assessment Lumbar Assessment:  (posterior tilt) Postural Control Trunk Control: pt can now sit EOB with supervision Righting Reactions: continues to be delayed, but is improving Protective Responses: delayed  Balance Static Sitting Balance Static Sitting - Level of Assistance: 5: Stand by assistance Dynamic Sitting Balance Dynamic Sitting - Level of Assistance: 5: Stand by assistance;4: Min assist (supervision with weight shift to left, min to weight shift to right) Extremity/Trunk Assessment RUE Assessment RUE Assessment: Exceptions to [facility] RUE AROM (degrees) Right Shoulder Flexion: 100 Degrees Right Shoulder ABduction: 60 Degrees Right Elbow Flexion: 100 Right Elbow Extension: 160 Right Composite Finger Extension: 75% Right Composite Finger Flexion: 75% LUE Assessment LUE Assessment: Within Functional Limits  See FIM for current functional status  SAGUIER,JULIA 08/04/2014, 12:13 PM

## 2014-08-04 NOTE — Progress Notes (Signed)
Social Work Discharge Note Discharge Note  The overall goal for the admission was met for:   Discharge location: [place] TO ASSIST-24 HR  Length of Stay: Yes-21 DAYS  Discharge activity level: Yes-MIN LEVEL  Home/community participation: Yes  Services provided included: MD, RD, PT, OT, SLP, RN, CM, TR, Pharmacy and SW  Financial Services: Private Insurance: [employer]  Follow-up services arranged: Home Health: [facility]-PT,OT,RN, DME: [employer]-WHEELCHAIR & DROP-ARM BSC and Patient/Family has no preference for HH/DME agencies  Comments (or additional information):[facility] [place].  RAMP NOT COMPLETED UNTIL NEXT SAT SO PLAN IS TO LIFT PT UP THE STAIRS, BY SON AND SON IN-LAW.  READY FOR DISCHARGE  Patient/Family verbalized understanding of follow-up arrangements: Yes  Individual responsible for coordination of the follow-up plan: Anita  Confirmed correct DME delivered: Elease Hashimoto 08/04/2014    Elease Hashimoto

## 2014-08-04 NOTE — Progress Notes (Signed)
Subjective/Complaints: 73 year old right-handed female with history of hypertension as well as diabetes mellitus with peripheral neuropathy. Presented to [facility] 07/12/2014 with acute onset of right-sided weakness. Cranial CT scan negative. MRI of the brain showed acute infarct left side of the internal capsule as well as remote lacunar infarct of the thalami bilaterally, the left caudate head and posterior right pons. Patient did not receive TPA. Echocardiogram unremarkable. Carotid Dopplers with no ICA stenosis. Neurology services consulted placed on aspirin for CVA prophylaxis.   Feeling well. Happy to get home  Review of Systems - Negative except R side weak  Objective: Vital Signs: Blood pressure 172/72, pulse 82, temperature 98.2 F (36.8 C), temperature source Oral, resp. rate 16, weight 60.102 kg (132 lb 8 oz), SpO2 100.00%. No results found. Results for orders placed during the hospital encounter of 07/14/14 (from the past 72 hour(s))  GLUCOSE, CAPILLARY     Status: Abnormal   Collection Time    08/01/14 11:28 AM      Result Value Ref Range   Glucose-Capillary 129 (*) 70 - 99 mg/dL   Comment 1 Notify RN    GLUCOSE, CAPILLARY     Status: None   Collection Time    08/01/14  4:37 PM      Result Value Ref Range   Glucose-Capillary 99  70 - 99 mg/dL   Comment 1 Notify RN    GLUCOSE, CAPILLARY     Status: Abnormal   Collection Time    08/01/14  9:08 PM      Result Value Ref Range   Glucose-Capillary 116 (*) 70 - 99 mg/dL  URINE CULTURE     Status: None   Collection Time    08/02/14  4:38 AM      Result Value Ref Range   Specimen Description URINE, CLEAN CATCH     Special Requests NONE     Culture  Setup Time       Value: 08/02/2014 05:33     Performed at [facility]       Value: 50,000 COLONIES/ML     Performed at [facility]   Culture       Value: GROUP B STREP(S.AGALACTIAE)ISOLATED     Note: TESTING AGAINST S.  AGALACTIAE NOT ROUTINELY PERFORMED DUE TO PREDICTABILITY OF AMP/PEN/VAN SUSCEPTIBILITY.     Performed at [facility]   Report Status 08/03/2014 FINAL    URINALYSIS, ROUTINE W REFLEX MICROSCOPIC     Status: Abnormal   Collection Time    08/02/14  4:38 AM      Result Value Ref Range   Color, Urine YELLOW  YELLOW   APPearance CLEAR  CLEAR   Specific Gravity, Urine 1.015  1.005 - 1.030   pH 6.5  5.0 - 8.0   Glucose, UA NEGATIVE  NEGATIVE mg/dL   Hgb urine dipstick NEGATIVE  NEGATIVE   Bilirubin Urine NEGATIVE  NEGATIVE   Ketones, ur NEGATIVE  NEGATIVE mg/dL   Protein, ur NEGATIVE  NEGATIVE mg/dL   Urobilinogen, UA 0.2  0.0 - 1.0 mg/dL   Nitrite NEGATIVE  NEGATIVE   Leukocytes, UA SMALL (*) NEGATIVE  URINE MICROSCOPIC-ADD ON     Status: None   Collection Time    08/02/14  4:38 AM      Result Value Ref Range   Squamous Epithelial / LPF RARE  RARE   WBC, UA 11-20  <3 WBC/hpf   Bacteria, UA RARE  RARE  GLUCOSE, CAPILLARY     Status: Abnormal   Collection Time    08/02/14  6:37 AM      Result Value Ref Range   Glucose-Capillary 65 (*) 70 - 99 mg/dL  GLUCOSE, CAPILLARY     Status: None   Collection Time    08/02/14  7:02 AM      Result Value Ref Range   Glucose-Capillary 93  70 - 99 mg/dL  GLUCOSE, CAPILLARY     Status: None   Collection Time    08/02/14 11:43 AM      Result Value Ref Range   Glucose-Capillary 97  70 - 99 mg/dL   Comment 1 Notify RN    GLUCOSE, CAPILLARY     Status: Abnormal   Collection Time    08/02/14  4:25 PM      Result Value Ref Range   Glucose-Capillary 123 (*) 70 - 99 mg/dL   Comment 1 Notify RN    GLUCOSE, CAPILLARY     Status: Abnormal   Collection Time    08/02/14  9:17 PM      Result Value Ref Range   Glucose-Capillary 142 (*) 70 - 99 mg/dL  GLUCOSE, CAPILLARY     Status: None   Collection Time    08/03/14  6:41 AM      Result Value Ref Range   Glucose-Capillary 89  70 - 99 mg/dL  GLUCOSE, CAPILLARY     Status: Abnormal    Collection Time    08/03/14 11:38 AM      Result Value Ref Range   Glucose-Capillary 131 (*) 70 - 99 mg/dL   Comment 1 Notify RN    GLUCOSE, CAPILLARY     Status: Abnormal   Collection Time    08/03/14  4:23 PM      Result Value Ref Range   Glucose-Capillary 129 (*) 70 - 99 mg/dL   Comment 1 Notify RN    GLUCOSE, CAPILLARY     Status: None   Collection Time    08/03/14  9:10 PM      Result Value Ref Range   Glucose-Capillary 99  70 - 99 mg/dL  GLUCOSE, CAPILLARY     Status: None   Collection Time    08/04/14  6:32 AM      Result Value Ref Range   Glucose-Capillary 76  70 - 99 mg/dL     HEENT: normal Cardio: RRR and no murmur Resp: CTA B/L and unlabored GI: BS positive and NT Extremity:  Pulses positive and No Edema Skin:   Intact and Other dry Neuro: Alert/Oriented, good insight. Normal Sensory, Abnormal Motor 3- R delt, bi, tri grip, 2/5 RLE HF, 3-KE, trace at anklet Reflexes: 3+ Musc/Skel:mild groin pain with R hip ROM Gen NAD   Assessment/Plan: 1. 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 Physiatrist is providing close team supervision and 24 hour management of active medical problems listed below. Physiatrist and rehab team continue to assess barriers to discharge/monitor patient progress toward functional and medical goals. Team conference today please see physician documentation under team conference tab, met with team face-to-face to discuss problems,progress, and goals. Formulized individual treatment plan based on medical history, underlying problem and comorbidities.  Dc home today. Goals met FIM: FIM - Bathing Bathing Steps Patient Completed: Chest;Right Arm;Abdomen;Front perineal area;Right upper leg;Left upper leg;Left lower leg (including  foot);Buttocks;Right lower leg (including foot);Left Arm Bathing: 4:  Steadying assist  FIM - Upper Body Dressing/Undressing Upper body dressing/undressing steps patient completed: Put head through opening of pull over shirt/dress;Thread/unthread left sleeve of pullover shirt/dress;Pull shirt over trunk;Thread/unthread right sleeve of pullover shirt/dresss Upper body dressing/undressing: 5: Supervision: Safety issues/verbal cues FIM - Lower Body Dressing/Undressing Lower body dressing/undressing steps patient completed: Thread/unthread right pants leg;Thread/unthread left pants leg;Thread/unthread right underwear leg;Pull underwear up/down;Pull pants up/down;Thread/unthread left underwear leg Lower body dressing/undressing: 3: Mod-Patient completed 50-74% of tasks  FIM - Toileting Toileting steps completed by patient: Performs perineal hygiene;Adjust clothing after toileting;Adjust clothing prior to toileting Toileting Assistive Devices: Grab bar or rail for support Toileting: 4: Steadying assist  FIM - Radio producer Devices: Grab bars Toilet Transfers: 4-To toilet/BSC: Min A (steadying Pt. > 75%);4-From toilet/BSC: Min A (steadying Pt. > 75%)  FIM - Bed/Chair Transfer Bed/Chair Transfer Assistive Devices: Arm rests;Orthosis Bed/Chair Transfer: 3: Chair or W/C > Bed: Mod A (lift or lower assist);4: Bed > Chair or W/C: Min A (steadying Pt. > 75%)  FIM - Locomotion: Wheelchair Distance: 200 Locomotion: Wheelchair: 1: Total Assistance/staff pushes wheelchair (Pt<25%) FIM - Locomotion: Ambulation Locomotion: Ambulation Assistive Devices: Other (comment) (railing on L in hallway) Ambulation/Gait Assistance: 2: Max assist Locomotion: Ambulation: 1: Travels less than 50 ft with maximal assistance (Pt: 25 - 49%)  Comprehension Comprehension Mode: Auditory Comprehension: 5-Understands basic 90% of the time/requires cueing < 10% of the time  Expression Expression Mode: Verbal Expression: 5-Expresses complex 90% of the time/cues <  10% of the time  Social Interaction Social Interaction: 5-Interacts appropriately 90% of the time - Needs monitoring or encouragement for participation or interaction.  Problem Solving Problem Solving: 5-Solves basic problems: With no assist  Memory Memory: 2-Recognizes or recalls 25 - 49% of the time/requires cueing 51 - 75% of the time   1. Functional deficits secondary to left CVA with right-sided weakness  2. DVT Prophylaxis/Anticoagulation: SCDs. Monitor for any signs of DVT  3. Pain Management: Tylenol as needed , R groin pain likely from hip, 9/19 R hip Xray,showing mod OA 4. Hypertension. Amlodipine 10 mg daily, lisinopril 40 mg daily, metoprolol 50 mg daily, hydrochlorothiazide 12.5 mg daily.BP on low side, elevated BUN will hold HCTZ Monitor with increased mobility  5. Neuropsych: This patient is capable of making decisions on her own behalf.   6. Skin/Wound Care: Routine skin checks  7. Diabetes mellitus with peripheral neuropathy,  trial gabapentin qhs.  - reduced Lantus insulin  slightly secondary to low am CBG---can stay with current dosing.   8. Hyperlipidemia. Atorvastatin 80 mg daily  9. Urinary tract infection ? Recurrence, low grade temp x 1. Cipro completed,reculture neg      LOS (Days) 21 A FACE TO FACE EVALUATION WAS PERFORMED    Megan Moore T 08/04/2014, 8:46 AM

## 2014-08-04 NOTE — Progress Notes (Signed)
Patient with husband at bedside given discharge information by Marissa NestlePam Love, PA, and all questions answered. Patient was assisted to car via wheelchair assisted by nurse tech.

## 2014-08-04 NOTE — Discharge Instructions (Signed)
Inpatient Rehab Discharge Instructions  Megan Moore Discharge date and time: 08/04/14    Activities/Precautions/ Functional Status: Activity: activity as tolerated Diet: diabetic diet Wound Care: none needed Functional status:  ___ No restrictions     ___ Walk up steps independently _X__ 24/7 supervision/assistance   ___ Walk up steps with assistance ___ Intermittent supervision/assistance  ___ Bathe/dress independently ___ Walk with walker     _X__ Bathe/dress with assistance ___ Walk Independently    ___ Shower independently _X__ Walk with assistance    ___ Shower with assistance _X__ No alcohol     ___ Return to work/school ________    STROKE/TIA DISCHARGE INSTRUCTIONS SMOKING Cigarette smoking nearly doubles your risk of having a stroke & is the single most alterable risk factor  If you smoke or have smoked in the last 12 months, you are advised to quit smoking for your health.  Most of the excess cardiovascular risk related to smoking disappears within a year of stopping.  Ask you doctor about anti-smoking medications  [place] Quit Line: 1-800-QUIT NOW  Free Smoking Cessation Classes [phone]  CHOLESTEROL Know your levels; limit fat & cholesterol in your diet  Lipid Panel  No results found for this basename: chol, trig, hdl, cholhdl, vldl, ldlcalc      Many patients benefit from treatment even if their cholesterol is at goal.  Goal: Total Cholesterol (CHOL) less than 160  Goal:  Triglycerides (TRIG) less than 150  Goal:  HDL greater than 40  Goal:  LDL (LDLCALC) less than 100   BLOOD PRESSURE American Stroke Association blood pressure target is less that 120/80 mm/Hg  Your discharge blood pressure is:  BP: 151/60 mmHg  Monitor your blood pressure  Limit your salt and alcohol intake  Many individuals will require more than one medication for high blood pressure  DIABETES (A1c is a blood sugar average for last 3 months) Goal HGBA1c is under 7% (HBGA1c is  blood sugar average for last 3 months)  Diabetes:     No results found for this basename: HGBA1C     Your HGBA1c can be lowered with medications, healthy diet, and exercise.  Check your blood sugar as directed by your physician  Call your physician if you experience unexplained or low blood sugars.  PHYSICAL ACTIVITY/REHABILITATION Goal is 30 minutes at least 4 days per week  Activity: Increase activity slowly, and No driving, Therapies: Physical Therapy: Home Health Return to work: N/A  Activity decreases your risk of heart attack and stroke and makes your heart stronger.  It helps control your weight and blood pressure; helps you relax and can improve your mood.  Participate in a regular exercise program.  Talk with your doctor about the best form of exercise for you (dancing, walking, swimming, cycling).  DIET/WEIGHT Goal is to maintain a healthy weight  Your discharge diet WC:BJSEG healthy/Diabetic diet. Thin  liquids Your height is:   Your current weight is: Weight: 60.102 kg (132 lb 8 oz) Your Body Mass Index (BMI) is:    Following the type of diet specifically designed for you will help prevent another stroke.  Your goal weight range is:    Your goal Body Mass Index (BMI) is 19-24.  Healthy food habits can help reduce 3 risk factors for stroke:  High cholesterol, hypertension, and excess weight.  RESOURCES Stroke/Support Group:  Call [phone]   STROKE EDUCATION PROVIDED/REVIEWED AND GIVEN TO PATIENT Stroke warning signs and symptoms How to activate emergency medical system (call  [phone]). Medications prescribed at discharge. Need for follow-up after discharge. Personal risk factors for stroke. Pneumonia vaccine given:  Flu vaccine given:  My questions have been answered, the writing is legible, and I understand these instructions.  I will adhere to these goals & educational materials that have been provided to me after my discharge from the hospital.    Special  Instructions:  1. Check blood sugars before meals and at bedtime.    COMMUNITY REFERRALS UPON DISCHARGE:    Home Health:   PT, OT, RN  Agency:[facility] [phone] Date of last service:08/04/2014  Medical Equipment/Items Or[facility]  Agency/Supplier:[employer]  [phone]   GENERAL COMMUNITY RESOURCES FOR PATIENT/FAMILY: Support Groups:[facility] SUPPORT GROUP   My questions have been answered and I understand these instructions. I will adhere to these goals and the provided educational materials after my discharge from the hospital.  Patient/Caregiver Signature _______________________________ Date __________  Clinician Signature _______________________________________ Date __________  Please bring this form and your medication list with you to all your follow-up doctor's appointments.

## 2014-08-04 NOTE — Progress Notes (Signed)
Occupational Therapy Session Note  Patient Details  Name: Megan Moore MRN: [id number] Date of Birth: 01/03/1941  Today's Date: 08/04/2014 OT Individual Time: 0905-1000 OT Individual Time Calculation (min): 55 min    Short Term Goals: Week 1:  OT Short Term Goal 1 (Week 1): Pt will perform LB dressing with Max A in order to increase I with self care OT Short Term Goal 1 - Progress (Week 1): Met OT Short Term Goal 2 (Week 1): Pt will perform shower transfer with Mod A in order to increase I in functional transfers. OT Short Term Goal 2 - Progress (Week 1): Other (comment) (We have not tried shower transfers yet, due to decreased sitting balance.) OT Short Term Goal 3 (Week 1): Pt will perform toilet transfer with Mod A in order to increase I in functional transfers.  OT Short Term Goal 3 - Progress (Week 1): Met OT Short Term Goal 4 (Week 1): Pt will perform toileting with Mod A in order to increase I with self care. OT Short Term Goal 4 - Progress (Week 1): Partly met OT Short Term Goal 5 (Week 1): Pt perform UB dressing with Min A in order to increase I in self care.  OT Short Term Goal 5 - Progress (Week 1): Met Week 2:  OT Short Term Goal 1 (Week 2): Pt will be able to transfer onto a tub bench in the walk in shower with mod A using grab bars and a squat pivot. OT Short Term Goal 1 - Progress (Week 2): Met OT Short Term Goal 2 (Week 2): Pt will be able to stand at the sink with min- mod A (versus mod-max A) to increase Independence with LB self care. OT Short Term Goal 2 - Progress (Week 2): Met OT Short Term Goal 3 (Week 2): Pt will demonstrate improved functional use of RUE to wash L arm with min A. OT Short Term Goal 3 - Progress (Week 2): Met OT Short Term Goal 4 (Week 2): Pt will be able to don pants over R foot and pull pants over her hips while being supported in standing. OT Short Term Goal 4 - Progress (Week 2): Met Week 3:  OT Short Term Goal 1 (Week 3): STGs =  LTGS  Skilled Therapeutic Interventions/Progress Updates:      Pt seen for BADL retraining of toileting, bathing, and dressing with a focus on safe functional transfers, use of RUE, and postural control and continued family education with pt's spouse and her son with is a Forensic scientist.  [name] spouse assisted her with bed mobility, bed to w/c, w/c to toilet, w/c to [facility]. The son also worked on [facility] transfers. Her spouse was able to recall steps of safe transfer and perform the transfers safely using a bobath technique by supporting her at the buttocks (without clothing) or with pants on.  Pt worked on standing for LB self care, but was having more difficulty due to fatigue in RLE.  Reviewed progress with RUE and educated pt and family on home activities to facilitate R arm movement (stacking plastic cups, polishing a table, folding small towels).   The new BSC arrived and reviewed with family and DME provider use of drop arm BSC and how it can be adjusted over toilet in the future when her bathroom may be accessible.  Her PT arrived for her next session.   Therapy Documentation Precautions:  Precautions Precautions: Fall Precaution Comments: pt pushes toward hemiplegic side Restrictions Weight  Bearing Restrictions: No    Vital Signs: Therapy Vitals Pulse Rate: 82 BP: 172/72 mmHg Pain: Pain Assessment Pain Assessment: No/denies pain ADL: See FIM for current functional status  Therapy/Group: Individual Therapy  SAGUIER,JULIA 08/04/2014, 11:53 AM

## 2014-08-04 NOTE — Progress Notes (Addendum)
Physical Therapy Discharge Summary  Patient Details  Name: Megan Moore MRN: [id number] Date of Birth: Dec 12, 1940  Today's Date: 08/04/2014 PT Individual Time: 1000-1110 PT Individual Time Calculation (min): 70 min   Patient has met 15 of 15 long term goals due to improved activity tolerance, improved balance, improved postural control, increased strength, ability to compensate for deficits, functional use of  right upper extremity and right lower extremity, improved attention and improved awareness.  Patient to discharge at a wheelchair level Supervision.   Patient's care partner is independent to provide the necessary physical and cognitive assistance at discharge.  Reasons goals not met: n/a  Recommendation:  Patient will benefit from ongoing skilled PT services in home health setting to continue to advance safe functional mobility, address ongoing impairments in strength, vestibular system, balance, activity tolerance, and minimize fall risk.  Equipment: rental w/c, no cushion per family's request; R blue rocher AFO  Reasons for discharge: treatment goals met and discharge from hospital  Patient/family agrees with progress made and goals achieved: Yes  PT Discharge- tx today: family ed with son Megan Moore and husband Megan Moore for simulated car transfer, bumping w/c up/down 6 steps for home entry, as ramp will not be completed until 08/11/14; HEP of neuro re-ed exs bil bridging, R unilateral bridging, bil lower trunk rotation all performed in supine; donning/doffing R AFO; folding w/c. Family return demonstrated above safely.  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  Family observed planned failure of : pt negotiating 2 steps L rail as per home situation, requiring max assist from PT,  and unsafe.  Precautions/Restrictions Precautions Precautions: Fall Precaution  Comments: pt pushes toward hemiplegic side Restrictions Weight Bearing Restrictions: No   Pain Pain Assessment Pain Assessment: No/denies pain Vision/Perception     Cognition Overall Cognitive Status: Within Functional Limits for tasks assessed Arousal/Alertness: Awake/alert Orientation Level: Oriented X4 Awareness: Impaired Safety/Judgment: Impaired Sensation Sensation Light Touch: Impaired Detail Light Touch Impaired Details: Absent RLE Proprioception: Impaired Detail Proprioception Impaired Details: Impaired RLE Coordination Gross Motor Movements are Fluid and Coordinated: No Fine Motor Movements are Fluid and Coordinated: No Heel Shin Test: NT Motor  Motor Motor: Hemiplegia;Abnormal postural alignment and control Motor - Discharge Observations: improved postural alignment in sitting; continues to push toward hemi side during sit> stand  Mobility- wearing FAFO Bed Mobility Bed Mobility:  (supervision for sit>< supine and scooting to HOB using rails) Transfers Transfers: Yes Squat Pivot Transfers: 3: Mod assist Squat Pivot Transfer Details: Manual facilitation for placement;Manual facilitation for weight shifting;Verbal cues for technique;Tactile cues for posture Squat Pivot Transfer Details (indicate cue type and reason): Bobath method with bil hands far forward improves wt shifting to elevate hips Locomotion-  R AFO Ambulation Ambulation: Yes Ambulation/Gait Assistance: 2: Max assist Assistive device: Other (Comment) (L railing in hall) Ambulation/Gait Assistance Details: Verbal cues for gait pattern;Manual facilitation for weight shifting;Manual facilitation for weight bearing;Tactile cues for posture Gait Gait: Yes Gait Pattern: Impaired Gait Pattern: Lateral hip instability;Trunk flexed;Decreased dorsiflexion - right;Right flexed knee in stance;Step-through pattern Gait velocity: impulsive Stairs / Additional Locomotion Stairs: Yes Stairs Assistance: 3: Mod  assist Stair Management Technique: Two Occupational hygienist Mobility: Yes Wheelchair Assistance: 5: Supervision Wheelchair Propulsion: Left upper extremity;Left lower extremity Wheelchair Parts Management: Needs assistance Distance: 150  Trunk/Postural Assessment  Cervical Assessment Cervical Assessment: Within Functional Limits Thoracic Assessment Thoracic Assessment: Within Functional  Limits Lumbar Assessment Lumbar Assessment: Exceptions to [facility] (sits in posterior pelvic tilt) Postural Control Postural Control: Deficits on evaluation Trunk Control: pt can now sit EOB with supervision; R trunk eccentric control limited Righting Reactions: continues to be delayed, but is improving Protective Responses: delayed and inadequate Postural Limitations: in standing, leans R, but corrects with cues  Balance Balance Balance Assessed: Yes Static Sitting Balance Static Sitting - Balance Support: Feet supported;No upper extremity supported Static Sitting - Level of Assistance: 6: Modified independent (Device/Increase time) Dynamic Sitting Balance Dynamic Sitting - Balance Support: Feet supported Dynamic Sitting - Level of Assistance: 5: Stand by assistance;4: Min assist Dynamic Sitting Balance - Compensations: VCs to slow down when reaching Static Standing Balance Static Standing - Level of Assistance: 2: Max assist Extremity Assessment      RLE Strength Right Hip Flexion: 2-/5 Right Knee Flexion: 2-/5 Right Knee Extension: 2-/5 Right Ankle Dorsiflexion: 0/5 Right Ankle Plantar Flexion: 0/5 RLE Tone RLE Tone: Hypertonic LLE Assessment LLE Assessment: Within Functional Limits  See FIM for current functional status  Efton Thomley 08/04/2014, 5:37 PM

## 2014-08-04 NOTE — Plan of Care (Signed)
Problem: RH BLADDER ELIMINATION Goal: RH STG MANAGE BLADDER WITH ASSISTANCE STG Manage Bladder With min Assistance  Outcome: Not Met (add Reason) Patient with incontinence at discharge requiring more than minimal assistance

## 2014-08-22 DIAGNOSIS — I10 Essential (primary) hypertension: Secondary | ICD-10-CM | POA: Diagnosis not present

## 2014-08-22 DIAGNOSIS — E1140 Type 2 diabetes mellitus with diabetic neuropathy, unspecified: Secondary | ICD-10-CM | POA: Diagnosis not present

## 2014-08-22 DIAGNOSIS — I69351 Hemiplegia and hemiparesis following cerebral infarction affecting right dominant side: Secondary | ICD-10-CM | POA: Diagnosis not present

## 2014-08-22 DIAGNOSIS — I6931 Cognitive deficits following cerebral infarction: Secondary | ICD-10-CM | POA: Diagnosis not present

## 2014-08-29 ENCOUNTER — Ambulatory Visit (HOSPITAL_BASED_OUTPATIENT_CLINIC_OR_DEPARTMENT_OTHER): Payer: Medicare PPO | Admitting: Physical Medicine & Rehabilitation

## 2014-08-29 ENCOUNTER — Encounter: Payer: Medicare PPO | Attending: Physical Medicine & Rehabilitation

## 2014-08-29 ENCOUNTER — Encounter: Payer: Self-pay | Admitting: Physical Medicine & Rehabilitation

## 2014-08-29 VITALS — BP 117/57 | HR 76 | Resp 14

## 2014-08-29 DIAGNOSIS — I69922 Dysarthria following unspecified cerebrovascular disease: Secondary | ICD-10-CM | POA: Insufficient documentation

## 2014-08-29 DIAGNOSIS — I6300 Cerebral infarction due to thrombosis of unspecified precerebral artery: Secondary | ICD-10-CM | POA: Insufficient documentation

## 2014-08-29 DIAGNOSIS — G8110 Spastic hemiplegia affecting unspecified side: Secondary | ICD-10-CM | POA: Insufficient documentation

## 2014-08-29 MED ORDER — TIZANIDINE HCL 4 MG PO TABS: 2.0000 mg | ORAL_TABLET | Freq: Three times a day (TID) | ORAL | Status: AC

## 2014-08-29 NOTE — Progress Notes (Signed)
Subjective:    Patient ID: Megan Moore, female    DOB: 1941-04-22, 73 y.o.   MRN: [id number]  HPI 73 year old right-handed female with history of hypertension as well as diabetes mellitus with peripheral neuropathy. Presented to [facility] 07/12/2014 with acute onset of right-sided weakness. Cranial CT scan negative. MRI of the brain showed acute infarct left side of the internal capsule as well as remote lacunar infarct of the thalami bilaterally, the left caudate head and posterior right pons. Patient did not receive TPA. Echocardiogram unremarkable. Carotid Dopplers with no ICA stenosis. Neurology services consulted placed on aspirin for CVA prophylaxis.   DATE OF ADMISSION:  07/14/2014 DATE OF DISCHARGE:  08/04/2014  Has returned home where she is receiving home health PT OT and speech therapy. Patient denies any spasticity denies any depression. She's had no falls. No recent travel. No significant pain complaints. Has had some pain with urination but this has been ongoing since acute care hospitalization. Finished antibiotics.  Pain Inventory Average Pain 0 Pain Right Now 0 My pain is no pain  In the last 24 hours, has pain interfered with the following? General activity 0 Relation with others 0 Enjoyment of life 0 What TIME of day is your pain at its worst? no pain Sleep (in general) no pain  Pain is worse with: no pain Pain improves with: no pain Relief from Meds: no pain  Mobility use a cane ability to climb steps?  no do you drive?  no use a wheelchair needs help with transfers  Function retired I need assistance with the following:  dressing, bathing, toileting and household duties  Neuro/Psych bladder control problems  Prior Studies Any changes since last visit?  no  Physicians involved in your care Any changes since last visit?  no   History reviewed. No pertinent family history. History   Social History  . Marital Status:  Married    Spouse Name: N/A    Number of Children: N/A  . Years of Education: N/A   Social History Main Topics  . Smoking status: Smoker, Current Status Unknown  . Smokeless tobacco: None  . Alcohol Use: None  . Drug Use: None  . Sexual Activity: None   Other Topics Concern  . None   Social History Narrative  . None   History reviewed. No pertinent past surgical history. No past medical history on file. BP 117/57  Pulse 76  Resp 14  SpO2 98%  Opioid Risk Score:   Fall Risk Score: High Fall Risk (>13 points) (educated receiving PT assistance in the home at present for fall prevention teaching)  Review of Systems  Constitutional: Positive for appetite change.  Endocrine:       High blood sugar  Genitourinary: Positive for dysuria.  All other systems reviewed and are negative.      Objective:   Physical Exam  Nursing note and vitals reviewed. Constitutional: She appears well-developed.  HENT:  Head: Normocephalic and atraumatic.  Eyes:  Visual fields intact to confrontation testing no evidence of neglect  Neurological: She is alert. She displays no tremor. Gait abnormal.  Psychiatric: She has a normal mood and affect.    Motor strength is 2 minus right deltoid, bicep, tricep, grip 3 minus right knee extensor 2 minus hip flexors 0 at the ankle dorsiflexor plantar flexion Expressively aphasic, short sentence level, mild dysarthria, good comprehension Sensory diminished to light touch in the right upper and right lower extremity Tone shows modified Ashworth  score 2 at the finger and wrist flexor as well as the elbow flexor in the right upper extremity      Assessment & Plan:  1. 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 In addition she does have some expressive aphasia this made be related to prior left subcortical infarct.  Recommend continued home health PT OT speech therapy Follow up in 1 month and consider referral to outpatient therapy at that  time.  Discussed findings, prognosis as well as further treatment recommendations with the patient and husband Over half of the 25 min visit was spent counseling and coordinating care.

## 2014-08-29 NOTE — Patient Instructions (Signed)
May need to do botox if tizanidine not helpful

## 2014-08-30 ENCOUNTER — Other Ambulatory Visit: Payer: Self-pay | Admitting: Emergency Medicine

## 2014-09-03 DEATH — deceased

## 2014-09-13 ENCOUNTER — Other Ambulatory Visit: Payer: Self-pay | Admitting: *Deleted

## 2014-09-13 MED ORDER — GABAPENTIN 300 MG PO CAPS: 300.0000 mg | ORAL_CAPSULE | Freq: Every day | ORAL | Status: AC

## 2014-09-13 NOTE — Telephone Encounter (Signed)
She was prescribed gaba[entin at discharge.  We have received a refill request for this from pharmacy.  Do you want to continue this?

## 2014-09-25 ENCOUNTER — Encounter: Payer: Medicare PPO | Attending: Physical Medicine & Rehabilitation

## 2014-09-25 ENCOUNTER — Ambulatory Visit: Payer: Self-pay | Admitting: Physical Medicine & Rehabilitation

## 2014-09-25 DIAGNOSIS — G8110 Spastic hemiplegia affecting unspecified side: Secondary | ICD-10-CM | POA: Insufficient documentation

## 2014-09-25 DIAGNOSIS — I69922 Dysarthria following unspecified cerebrovascular disease: Secondary | ICD-10-CM | POA: Insufficient documentation

## 2014-09-25 DIAGNOSIS — I6300 Cerebral infarction due to thrombosis of unspecified precerebral artery: Secondary | ICD-10-CM | POA: Insufficient documentation

## 2015-10-05 IMAGING — CT CT CHEST W/O CM
1 series · 15 of 34 positions shown, 19 images · non-contrast
Comparison: none

REASON FOR EXAM: lung CA FU
COMMENTS:

PROCEDURE:     CT  - CT CHEST WITHOUT CONTRAST  - August 18, 2013 [DATE]
RESULT:
Comparison is mad to a prior study dated 02/17/2013.
TECHNIQUE: Helical noncontrast 3 mm sections were obtained from the thoracic
inlet through the lung bases.

[Series 2: soft tissue · axial · 0.67mm/px · z∈[-160,+76]mm · 15 of 93 slices shown, 19 images]
[im 7/93  mediastinal]
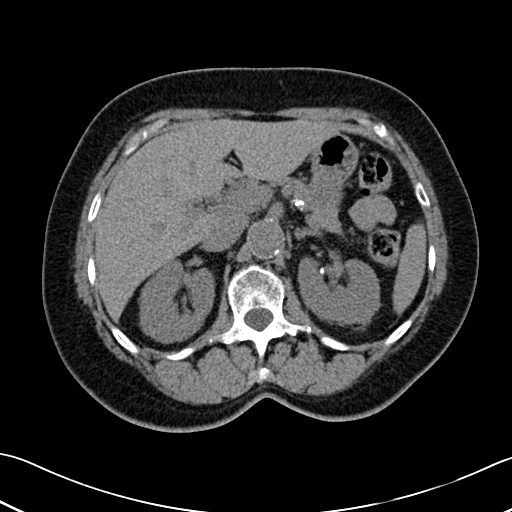
[im 7/93  lung]
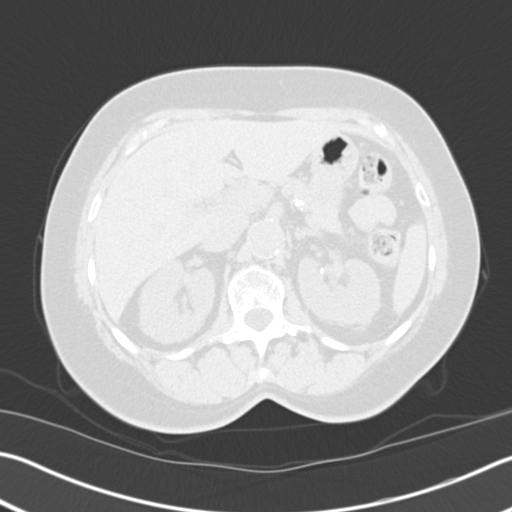
[im 14/93  lung]
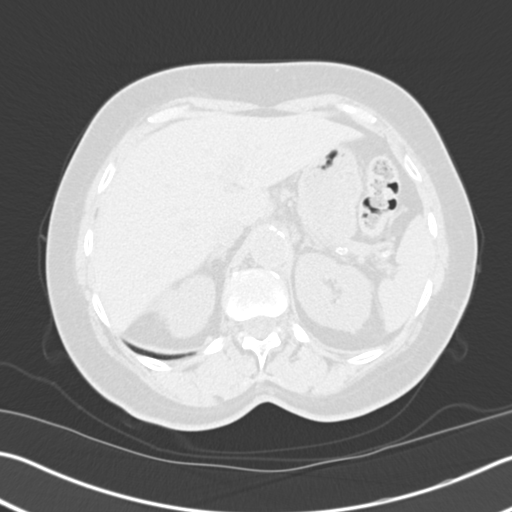
[im 19/93  lung]
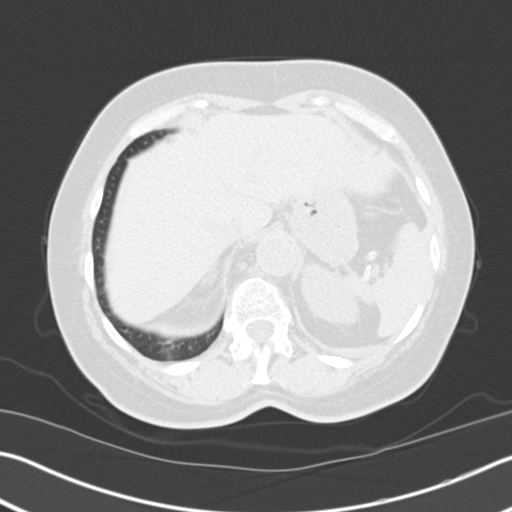
[im 24/93  lung]
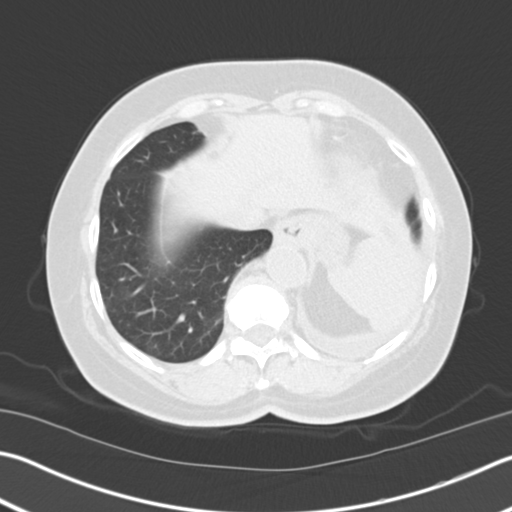
[im 31/93  mediastinal]
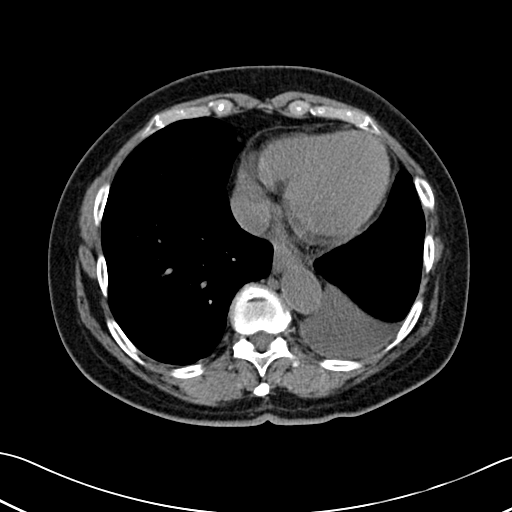
[im 31/93  lung]
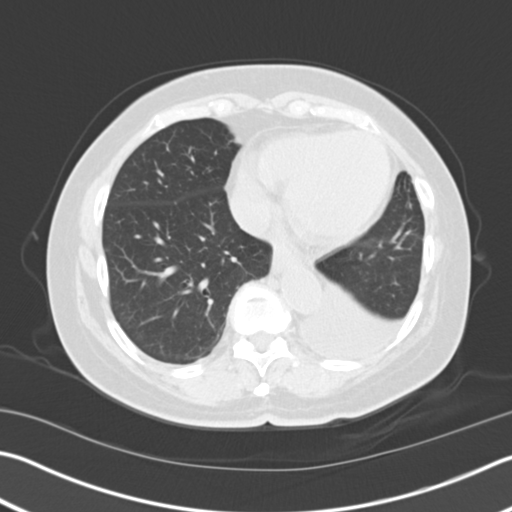
[im 37/93  lung]
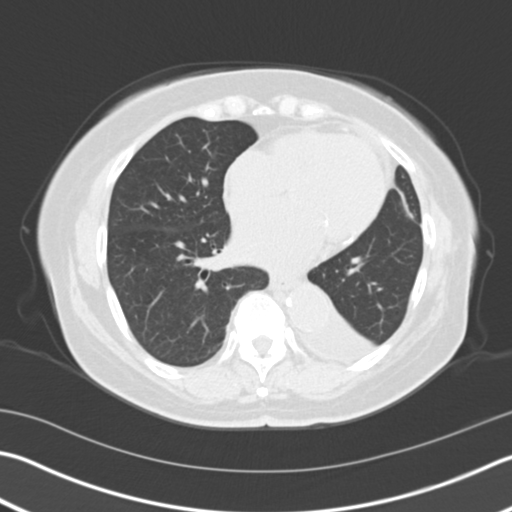
[im 41/93  lung]
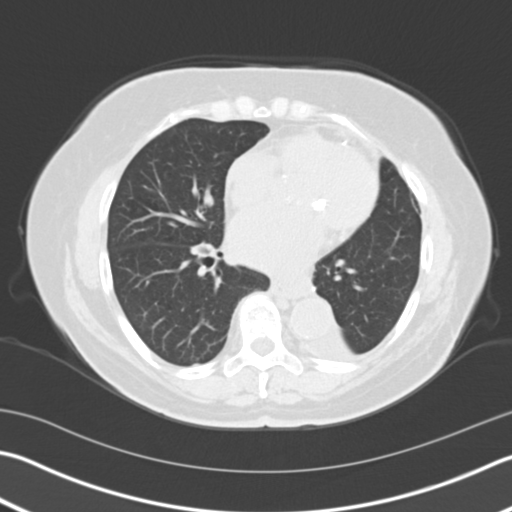
[im 48/93  lung]
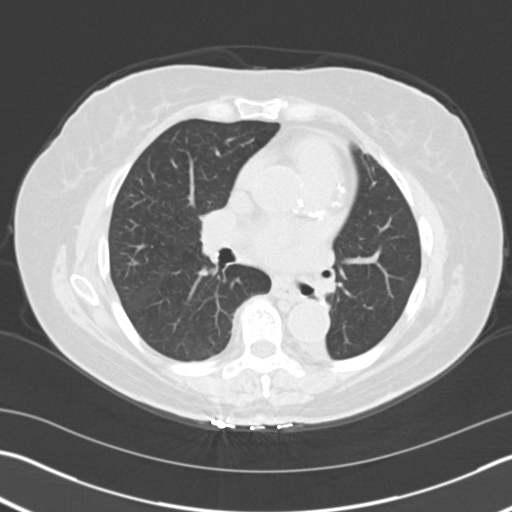
[im 52/93  mediastinal]
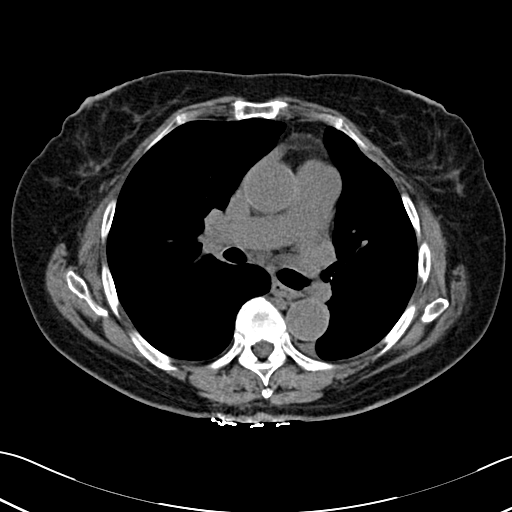
[im 52/93  lung]
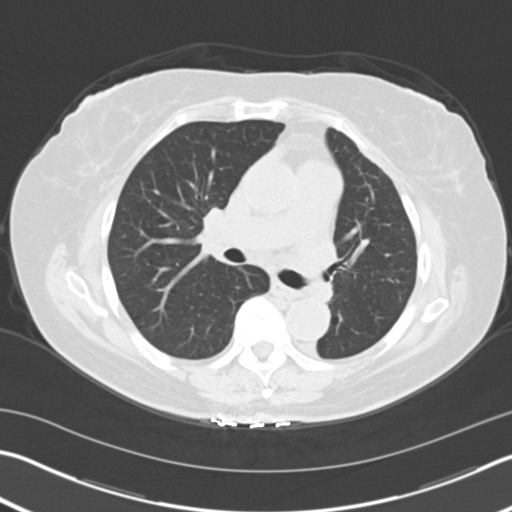
[im 56/93  lung]
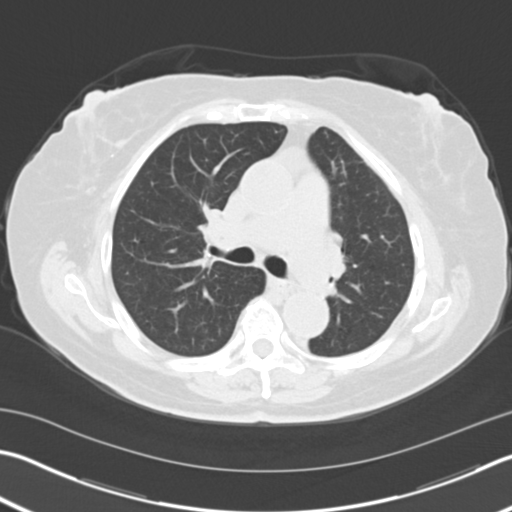
[im 62/93  lung]
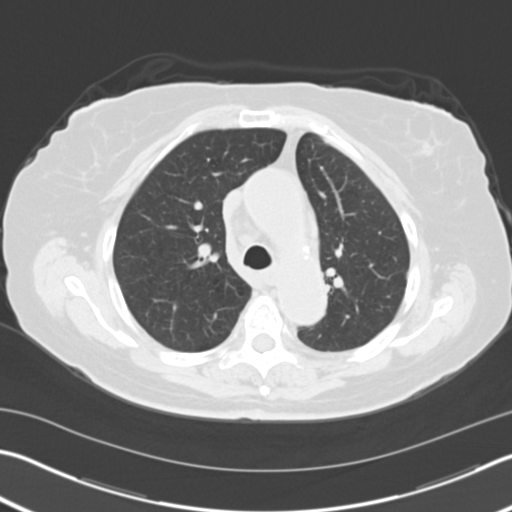
[im 69/93  lung]
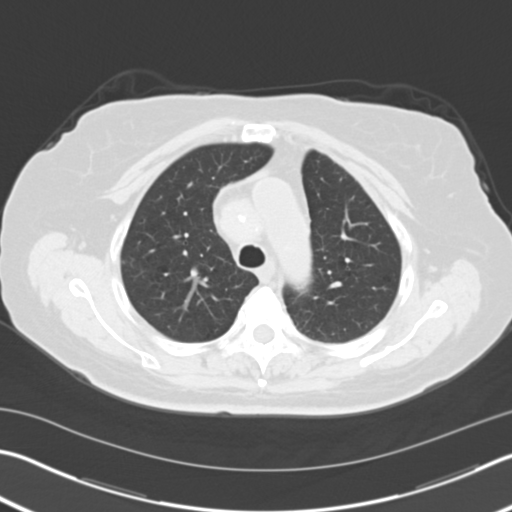
[im 74/93  mediastinal]
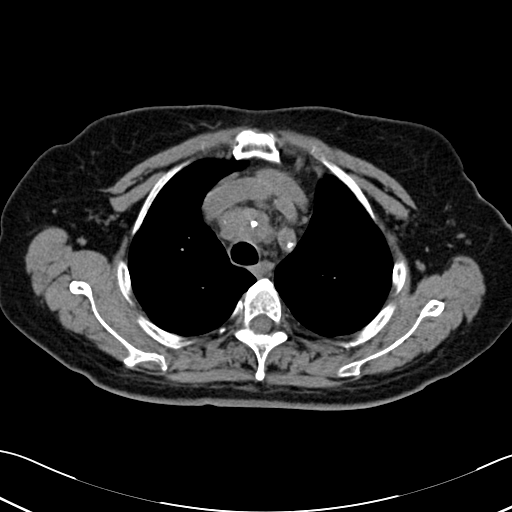
[im 74/93  lung]
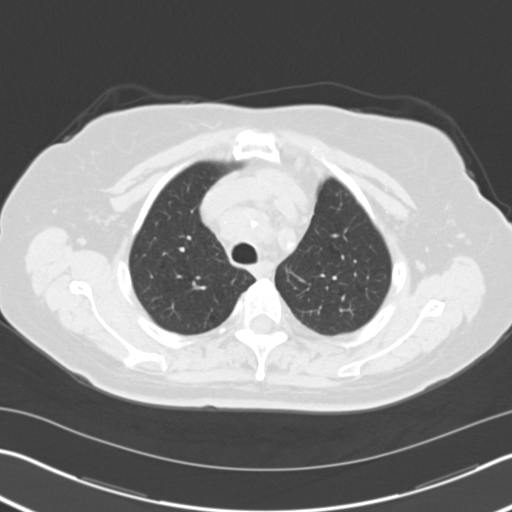
[im 79/93  lung]
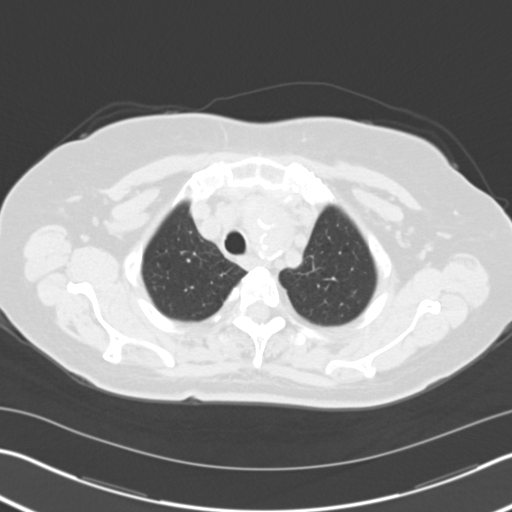
[im 86/93  lung]
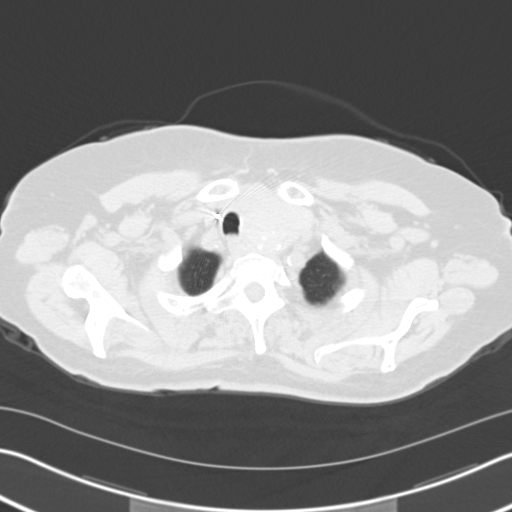

[15 of 34 positions shown; findings below may reference images not displayed]

FINDINGS: Evaluation of the thoracic inlet once again demonstrates a large
mass involving the left lobe of the thyroid with substernal and superior
mediastinal extension. Coarse calcifications are scattered throughout
portions of the mass. When compared to the previous study, this finding
appears stable. There does not appear to be CT evidence of appreciable
pathologic sized adenopathy. The vague, ground-glass nodular density within
the right upper lobe is once again appreciated, unchanged, and is
appreciated on image #29 of the lung series. This area measures grossly
mm. No further pulmonary nodules or masses are identified. A small pleural
effusion is once again appreciated within the left lung base and this
finding is stable. This finding is consistent with the patient's reported
history of prior left lower lobectomy.

The visualized upper abdominal viscera demonstrate no gross abnormalities.
IMPRESSION: 1.  Stable, vague, ground-glass nodular density in the right upper lobe.
2.  Stable thyroid mass likely representing a goiter.
3.  Post surgical changes, left lung base, which are also stable.
